# Patient Record
Sex: Female | Born: 1965 | Race: White | Hispanic: No | Smoking: Never smoker
Health system: Southern US, Community
[De-identification: ages and names within clinical notes are randomized; demographics above are authoritative.]

## PROBLEM LIST (undated history)

## (undated) DIAGNOSIS — R112 Nausea with vomiting, unspecified: Secondary | ICD-10-CM

## (undated) DIAGNOSIS — Z9889 Other specified postprocedural states: Secondary | ICD-10-CM

## (undated) DIAGNOSIS — K219 Gastro-esophageal reflux disease without esophagitis: Secondary | ICD-10-CM

## (undated) DIAGNOSIS — F32A Depression, unspecified: Secondary | ICD-10-CM

## (undated) DIAGNOSIS — G4730 Sleep apnea, unspecified: Secondary | ICD-10-CM

## (undated) DIAGNOSIS — G5603 Carpal tunnel syndrome, bilateral upper limbs: Secondary | ICD-10-CM

## (undated) HISTORY — PX: TUBAL LIGATION: SHX77

## (undated) HISTORY — PX: COLONOSCOPY: SHX174

## (undated) HISTORY — PX: OTHER SURGICAL HISTORY: SHX169

## (undated) HISTORY — PX: CHOLECYSTECTOMY: SHX55

## (undated) HISTORY — PX: LUMBAR FUSION: SHX111

## (undated) HISTORY — DX: Gastro-esophageal reflux disease without esophagitis: K21.9

---

## 2019-11-26 ENCOUNTER — Other Ambulatory Visit: Payer: Self-pay | Admitting: Surgery

## 2019-11-26 ENCOUNTER — Other Ambulatory Visit (HOSPITAL_COMMUNITY): Payer: Self-pay | Admitting: Surgery

## 2019-12-11 ENCOUNTER — Other Ambulatory Visit: Payer: Self-pay

## 2019-12-11 ENCOUNTER — Ambulatory Visit (HOSPITAL_COMMUNITY)
Admission: RE | Admit: 2019-12-11 | Discharge: 2019-12-11 | Disposition: A | Discharge status: Home / Self Care | Payer: Federal, State, Local not specified - PPO | Source: Ambulatory Visit | Attending: Surgery | Admitting: Surgery

## 2019-12-11 ENCOUNTER — Encounter: Payer: Federal, State, Local not specified - PPO | Attending: Surgery | Admitting: Skilled Nursing Facility1

## 2019-12-11 ENCOUNTER — Encounter: Payer: Self-pay | Admitting: Skilled Nursing Facility1

## 2019-12-11 DIAGNOSIS — E669 Obesity, unspecified: Secondary | ICD-10-CM | POA: Diagnosis present

## 2019-12-11 NOTE — Progress Notes (Signed)
Nutrition Assessment for Bariatric Surgery Medical Nutrition Therapy  Patient was seen on 12/11/2019 for Pre-Operative Nutrition Assessment. Letter of approval faxed to [facility] bariatric surgery program coordinator on 12/11/2019  Referral stated Supervised Weight Loss (SWL) visits needed: 3; but states is very adamant she does not need any and she is getting surgery in July so did not set pt up for SWL visits will do so if [facility] advises to do that  Planned surgery: RYGB Pt expectation of surgery: to lose weight Pt expectation of dietitian: none     NUTRITION ASSESSMENT   Anthropometrics  Start weight at [facility]: 213.8 lbs (date: 12/11/2019)  Height: 4 foot 11.8in in BMI: 41.88 kg/m2     Clinical  Medical hx: GERD Medications: See list Labs: none Notable signs/symptoms: some back aching   Lifestyle & Dietary Hx  Pt state she would not stop trying to lose weight if she did not get to 125 pounds. Pt states she has a horse Pricilla Holm) she enjoys training. Pt state she takes Wellbutrin because she gets panic attacks from having come off the Contrave.   24-Hr Dietary Recall First Meal: coffee + italian sweet cream creamer and waffles + sausage   Snack: cookies Second Meal: sandwich + pretzels Snack: crackers Third Meal: pork or chicken + rice + broccoli Snack: pudding or cookies Beverages: water, diet soda, coffee, decaf coffee   Estimated Energy Needs Calories: 1400 Carbohydrate: 158g Protein: 105 g Fat: 39g   NUTRITION DIAGNOSIS  Overweight/obesity ([place]-3.3) related to past poor dietary habits and physical inactivity as evidenced by patient w/ planned RYGB surgery following dietary guidelines for continued weight loss.    NUTRITION INTERVENTION  Nutrition counseling (C-1) and education (E-2) to facilitate bariatric surgery goals.   Pre-Op Goals Reviewed with the Patient . Track food and beverage intake (pen and paper, MyFitness Pal, Baritastic app,  etc.) . Make healthy food choices while monitoring portion sizes . Consume 3 meals per day or try to eat every 3-5 hours . Avoid concentrated sugars and fried foods . Keep sugar & fat in the single digits per serving on food labels . Practice CHEWING your food (aim for applesauce consistency) . Practice not drinking 15 minutes before, during, and 30 minutes after each meal and snack . Avoid all carbonated beverages (ex: soda, sparkling beverages)  . Limit caffeinated beverages (ex: coffee, tea, energy drinks) . Avoid all sugar-sweetened beverages (ex: regular soda, sports drinks)  . Avoid alcohol  . Aim for 64-100 ounces of FLUID daily (with at least half of fluid intake being plain water)  . Aim for at least 60-80 grams of PROTEIN daily . Look for a liquid protein source that contains ?15 g protein and ?5 g carbohydrate (ex: shakes, drinks, shots) . Make a list of non-food related activities . Physical activity is an important part of a healthy lifestyle so keep it moving! The goal is to reach 150 minutes of exercise per week, including cardiovascular and weight baring activity.  *Goals that are bolded indicate the pt would like to start working towards these  Handouts Provided Include  . Bariatric Surgery handouts (Nutrition Visits, Pre-Op Goals, Protein Shakes, Vitamins & Minerals)  Learning Style & Readiness for Change Teaching method utilized: Visual & Auditory  Demonstrated degree of understanding via: Teach Back  Barriers to learning/adherence to lifestyle change: none identified      MONITORING & EVALUATION Dietary intake, weekly physical activity, body weight, and pre-op goals reached at next nutrition visit.  Next Steps  Patient is to follow up at [facility] for Pre-Op Class >2 weeks before surgery for further nutrition education.

## 2019-12-12 ENCOUNTER — Other Ambulatory Visit (HOSPITAL_COMMUNITY): Payer: Federal, State, Local not specified - PPO

## 2019-12-12 ENCOUNTER — Ambulatory Visit (HOSPITAL_COMMUNITY): Payer: Federal, State, Local not specified - PPO

## 2019-12-16 ENCOUNTER — Ambulatory Visit: Payer: Federal, State, Local not specified - PPO | Admitting: Skilled Nursing Facility1

## 2020-01-01 ENCOUNTER — Ambulatory Visit (INDEPENDENT_AMBULATORY_CARE_PROVIDER_SITE_OTHER): Payer: Federal, State, Local not specified - PPO | Admitting: Psychology

## 2020-01-01 DIAGNOSIS — F509 Eating disorder, unspecified: Secondary | ICD-10-CM | POA: Diagnosis not present

## 2020-01-08 ENCOUNTER — Ambulatory Visit (INDEPENDENT_AMBULATORY_CARE_PROVIDER_SITE_OTHER): Payer: Federal, State, Local not specified - PPO | Admitting: Psychology

## 2020-01-08 ENCOUNTER — Ambulatory Visit: Payer: Federal, State, Local not specified - PPO | Admitting: Psychology

## 2020-01-08 DIAGNOSIS — F509 Eating disorder, unspecified: Secondary | ICD-10-CM | POA: Diagnosis not present

## 2020-03-15 ENCOUNTER — Other Ambulatory Visit: Payer: Self-pay

## 2020-03-15 ENCOUNTER — Encounter: Payer: Federal, State, Local not specified - PPO | Attending: Surgery | Admitting: Skilled Nursing Facility1

## 2020-03-15 DIAGNOSIS — E669 Obesity, unspecified: Secondary | ICD-10-CM | POA: Insufficient documentation

## 2020-03-15 NOTE — Progress Notes (Signed)
Pre-Operative Nutrition Class:  Appt start time: 2671   End time:  1830.  Patient was seen on 03/15/2020 for Pre-Operative [facility]ariatric Surgery [facility]    Surgery date:  Surgery type: RYGB Start weight at [facility]: 213.8 Weight today: 210    The following the learning objectives were met by the patient during this course:  Identify Pre-Op Dietary Goals and will begin 2 weeks pre-operatively  Identify appropriate sources of fluids and proteins   State protein recommendations and appropriate sources pre and post-operatively  Identify Post-Operative Dietary Goals and will follow for 2 weeks post-operatively  Identify appropriate multivitamin and calcium sources  Describe the need for physical activity post-operatively and will follow MD recommendations  State when to call healthcare provider regarding medication questions or post-operative complications  Handouts given during class include:  Pre-Op Bariatric Surgery Diet Handout  Protein Shake Handout  Post-Op Bariatric Surgery Nutrition Handout  [facility] Program Information Flyer  Support Group Information Flyer  [facility] Bariatric Supplements Price List  Follow-Up Plan: Patient will follow-up at [facility] 2 weeks post operatively for diet advancement per MD.

## 2020-04-07 ENCOUNTER — Encounter (HOSPITAL_COMMUNITY): Payer: Self-pay

## 2020-04-07 ENCOUNTER — Other Ambulatory Visit: Payer: Self-pay | Admitting: Surgery

## 2020-04-07 NOTE — Patient Instructions (Addendum)
DUE TO COVID-19 ONLY ONE VISITOR IS ALLOWED TO COME WITH YOU AND STAY IN THE WAITING ROOM ONLY DURING PRE OP AND PROCEDURE.   IF YOU WILL BE ADMITTED INTO THE HOSPITAL YOU ARE ALLOWED ONE SUPPORT PERSON DURING VISITATION HOURS ONLY (10AM -8PM)   . The support person may change daily. . The support person must pass our screening, gel in and out, and wear a mask at all times, including in the patient's room. . Patients must also wear a mask when staff or their support person are in the room.   COVID SWAB TESTING MUST BE COMPLETED ON: Friday, Sept. 24, 2021 at 2:55 PM   [address] [place], [place] [address]  (Must self quarantine after testing. Follow instructions on handout.)       Your procedure is scheduled on: Tuesday, Sept 28, 2021   Report to [facility] Main  Entrance    Report to admitting at 7:45 AM   Call this number if you have problems the morning of surgery [phone]   NO SOLID FOOD AFTER 600 PM THE NIGHT BEFORE YOUR SURGERY.    YOU MAY DRINK CLEAR FLUIDS.    CLEAR LIQUID DIET  Foods Allowed                                                                     Foods Excluded  Water, Black Coffee and tea, regular and decaf                             liquids that you cannot  Plain Jell-O in any flavor  (No red)                                           see through such as: Fruit ices (not with fruit pulp)                                     milk, soups, orange juice  Iced Popsicles (No red)                                    All solid food                                   Apple juices Sports drinks like Gatorade (No red) Lightly seasoned clear broth or consume(fat free) Sugar, honey syrup  Sample Menu Breakfast                                Lunch                                     Supper Cranberry juice  Beef broth                            Chicken broth Jell-O                                     Grape juice                            Apple juice Coffee or tea                        Jell-O                                      Popsicle                                                Coffee or tea                        Coffee or tea   MORNING OF SURGERY DRINK:   DRINK 1 G2 drink BEFORE YOU LEAVE HOME, DRINK ALL OF THE  G2 DRINK AT ONE TIME.  THE G2 DRINK YOU DRINK BEFORE YOU LEAVE HOME WILL BE  THE LAST FLUIDS YOU DRINK BEFORE SURGERY.    May have liquids until    day of surgery  CLEAR LIQUID DIET  Foods Allowed                                                                     Foods Excluded  Water, Black Coffee and tea, regular and decaf                             liquids that you cannot  Plain Jell-O in any flavor  (No red)                                           see through such as: Fruit ices (not with fruit pulp)                                     milk, soups, orange juice              Iced Popsicles (No red)                                    All solid food  Apple juices Sports drinks like Gatorade (No red) Lightly seasoned clear broth or consume(fat free) Sugar, honey syrup  Sample Menu Breakfast                                Lunch                                     Supper Cranberry juice                    Beef broth                            Chicken broth Jell-O                                     Grape juice                           Apple juice Coffee or tea                        Jell-O                                      Popsicle                                                Coffee or tea                        Coffee or tea         Oral Hygiene is also important to reduce your risk of infection.                                    Remember - BRUSH YOUR TEETH THE MORNING OF SURGERY WITH YOUR REGULAR TOOTHPASTE   Do NOT smoke after Midnight   Take these medicines the morning of surgery with A SIP OF WATER: Bupropion, Famotidine   Bring CPAP mask and tubing  day of surgery                               You may not have any metal on your body including hair pins, jewelry, and body piercings             Do not wear make-up, lotions, powders, perfumes/cologne, or deodorant             Do not wear nail polish.  Do not shave  48 hours prior to surgery.               Do not bring valuables to the hospital. [facility] IS NOT             RESPONSIBLE   FOR VALUABLES.   Contacts, dentures or bridgework may not be worn into surgery.  Patients discharged the day of surgery will not be allowed to drive home.   Special Instructions: Bring a copy of your healthcare power of attorney and living will documents         the day of surgery if you haven't scanned them in before.              Please read over the following fact sheets you were given: IF YOU HAVE QUESTIONS ABOUT YOUR PRE OP INSTRUCTIONS PLEASE CALL [phone]   PAIN IS EXPECTED AFTER SURGERY AND WILL NOT BE COMPLETELY ELIMINATED. AMBULATION AND TYLENOL WILL HELP REDUCE INCISIONAL AND GAS PAIN. MOVEMENT IS KEY!   YOU ARE EXPECTED TO BE OUT OF BED WITHIN 4 HOURS OF ADMISSION TO YOUR PATIENT ROOM.   SITTING IN THE RECLINER THROUGHOUT THE DAY IS IMPORTANT FOR DRINKING FLUIDS AND MOVING GAS THROUGHOUT THE GI TRACT.   COMPRESSION STOCKINGS SHOULD BE WORN [facility] STAY UNLESS YOU ARE WALKING.    INCENTIVE SPIROMETER SHOULD BE USED EVERY HOUR WHILE AWAKE TO DECREASE POST-OPERATIVE COMPLICATIONS SUCH AS PNEUMONIA.   WHEN DISCHARGED HOME, IT IS IMPORTANT TO CONTINUE TO WALK EVERY HOUR AND USE THE INCENTIVE SPIROMETER EVERY HOUR.      [facility] - Preparing for Surgery Before surgery, you can play an important role.  Because skin is not sterile, your skin needs to be as free of germs as possible.  You can reduce the number of germs on your skin by washing with CHG (chlorahexidine gluconate) soap before surgery.  CHG is an antiseptic cleaner which kills germs and bonds with the skin to  continue killing germs even after washing. Please DO NOT use if you have an allergy to CHG or antibacterial soaps.  If your skin becomes reddened/irritated stop using the CHG and inform your nurse when you arrive at Short Stay. Do not shave (including legs and underarms) for at least 48 hours prior to the first CHG shower.  You may shave your face/neck.  Please follow these instructions carefully:  1.  Shower with CHG Soap the night before surgery and the  morning of surgery.  2.  If you choose to wash your hair, wash your hair first as usual with your normal  shampoo.  3.  After you shampoo, rinse your hair and body thoroughly to remove the shampoo.                             4.  Use CHG as you would any other liquid soap.  You can apply chg directly to the skin and wash.  Gently with a scrungie or clean washcloth.  5.  Apply the CHG Soap to your body ONLY FROM THE NECK DOWN.   Do   not use on face/ open                           Wound or open sores. Avoid contact with eyes, ears mouth and   genitals (private parts).                       Wash face,  Genitals (private parts) with your normal soap.             6.  Wash thoroughly, paying special attention to the area where your    surgery  will be performed.  7.  Thoroughly rinse your body with warm water  from the neck down.  8.  DO NOT shower/wash with your normal soap after using and rinsing off the CHG Soap.                9.  Pat yourself dry with a clean towel.            10.  Wear clean pajamas.            11.  Place clean sheets on your bed the night of your first shower and do not  sleep with pets. Day of Surgery : Do not apply any lotions/deodorants the morning of surgery.  Please wear clean clothes to the hospital/surgery center.  FAILURE TO FOLLOW THESE INSTRUCTIONS MAY RESULT IN THE CANCELLATION OF YOUR SURGERY  PATIENT SIGNATURE_________________________________  NURSE  SIGNATURE__________________________________  ________________________________________________________________________   Rogelia MireIncentive Spirometer  An incentive spirometer is a tool that can help keep your lungs clear and active. This tool measures how well you are filling your lungs with each breath. Taking long deep breaths may help reverse or decrease the chance of developing breathing (pulmonary) problems (especially infection) following:  A long period of time when you are unable to move or be active. BEFORE THE PROCEDURE   If the spirometer includes an indicator to show your best effort, your nurse or respiratory therapist will set it to a desired goal.  If possible, sit up straight or lean slightly forward. Try not to slouch.  Hold the incentive spirometer in an upright position. INSTRUCTIONS FOR USE  1. Sit on the edge of your bed if possible, or sit up as far as you can in bed or on a chair. 2. Hold the incentive spirometer in an upright position. 3. Breathe out normally. 4. Place the mouthpiece in your mouth and seal your lips tightly around it. 5. Breathe in slowly and as deeply as possible, raising the piston or the ball toward the top of the column. 6. Hold your breath for 3-5 seconds or for as long as possible. Allow the piston or ball to fall to the bottom of the column. 7. Remove the mouthpiece from your mouth and breathe out normally. 8. Rest for a few seconds and repeat Steps 1 through 7 at least 10 times every 1-2 hours when you are awake. Take your time and take a few normal breaths between deep breaths. 9. The spirometer may include an indicator to show your best effort. Use the indicator as a goal to work toward during each repetition. 10. After each set of 10 deep breaths, practice coughing to be sure your lungs are clear. If you have an incision (the cut made at the time of surgery), support your incision when coughing by placing a pillow or rolled up towels firmly  against it. Once you are able to get out of bed, walk around indoors and cough well. You may stop using the incentive spirometer when instructed by your caregiver.  RISKS AND COMPLICATIONS  Take your time so you do not get dizzy or light-headed.  If you are in pain, you may need to take or ask for pain medication before doing incentive spirometry. It is harder to take a deep breath if you are having pain. AFTER USE  Rest and breathe slowly and easily.  It can be helpful to keep track of a log of your progress. Your caregiver can provide you with a simple table to help with this. If you are using the spirometer at home, follow these instructions: SEEK  MEDICAL CARE IF:   You are having difficultly using the spirometer.  You have trouble using the spirometer as often as instructed.  Your pain medication is not giving enough relief while using the spirometer.  You develop fever of 100.5 F (38.1 C) or higher. SEEK IMMEDIATE MEDICAL CARE IF:   You cough up bloody sputum that had not been present before.  You develop fever of 102 F (38.9 C) or greater.  You develop worsening pain at or near the incision site. MAKE SURE YOU:   Understand these instructions.  Will watch your condition.  Will get help right away if you are not doing well or get worse. Document Released: 11/27/2006 Document Revised: 10/09/2011 Document Reviewed: 01/28/2007 Exit[employer] Patient Information 2014 ExitCare, [place].   ________________________________________________________________________  WHAT IS A BLOOD TRANSFUSION? Blood Transfusion Information  A transfusion is the replacement of blood or some of its parts. Blood is made up of multiple cells which provide different functions.  Red blood cells carry oxygen and are used for blood loss replacement.  White blood cells fight against infection.  Platelets control bleeding.  Plasma helps clot blood.  Other blood products are available for  specialized needs, such as hemophilia or other clotting disorders. BEFORE THE TRANSFUSION  Who gives blood for transfusions?   Healthy volunteers who are fully evaluated to make sure their blood is safe. This is blood bank blood. Transfusion therapy is the safest it has ever been in the practice of medicine. Before blood is taken from a donor, a complete history is taken to make sure that person has no history of diseases nor engages in risky social behavior (examples are intravenous drug use or sexual activity with multiple partners). The donor's travel history is screened to minimize risk of transmitting infections, such as malaria. The donated blood is tested for signs of infectious diseases, such as HIV and hepatitis. The blood is then tested to be sure it is compatible with you in order to minimize the chance of a transfusion reaction. If you or a relative donates blood, this is often done in anticipation of surgery and is not appropriate for emergency situations. It takes many days to process the donated blood. RISKS AND COMPLICATIONS Although transfusion therapy is very safe and saves many lives, the main dangers of transfusion include:   Getting an infectious disease.  Developing a transfusion reaction. This is an allergic reaction to something in the blood you were given. Every precaution is taken to prevent this. The decision to have a blood transfusion has been considered carefully by your caregiver before blood is given. Blood is not given unless the benefits outweigh the risks. AFTER THE TRANSFUSION  Right after receiving a blood transfusion, you will usually feel much better and more energetic. This is especially true if your red blood cells have gotten low (anemic). The transfusion raises the level of the red blood cells which carry oxygen, and this usually causes an energy increase.  The nurse administering the transfusion will monitor you carefully for complications. HOME CARE  INSTRUCTIONS  No special instructions are needed after a transfusion. You may find your energy is better. Speak with your caregiver about any limitations on activity for underlying diseases you may have. SEEK MEDICAL CARE IF:   Your condition is not improving after your transfusion.  You develop redness or irritation at the intravenous (IV) site. SEEK IMMEDIATE MEDICAL CARE IF:  Any of the following symptoms occur over the next 12 hours:  Shaking chills.  You have a temperature by mouth above 102 F (38.9 C), not controlled by medicine.  Chest, back, or muscle pain.  People around you feel you are not acting correctly or are confused.  Shortness of breath or difficulty breathing.  Dizziness and fainting.  You get a rash or develop hives.  You have a decrease in urine output.  Your urine turns a dark color or changes to pink, red, or brown. Any of the following symptoms occur over the next 10 days:  You have a temperature by mouth above 102 F (38.9 C), not controlled by medicine.  Shortness of breath.  Weakness after normal activity.  The white part of the eye turns yellow (jaundice).  You have a decrease in the amount of urine or are urinating less often.  Your urine turns a dark color or changes to pink, red, or brown. Document Released: 07/14/2000 Document Revised: 10/09/2011 Document Reviewed: 03/02/2008 [facility] Patient Information 2014 [place], [place].  _______________________________________________________________________

## 2020-04-07 NOTE — Progress Notes (Addendum)
COVID Vaccine Completed: Yes Date COVID Vaccine completed: 08/04/2019, 08/25/2019 COVID vaccine manufacturer: Pfizer      PCP - Linton Rump NP Cardiologist - N/A  Chest x-ray - 12/12/19 in epic EKG - 12/11/2019 in epic Stress Test - N/A ECHO - N/A Cardiac Cath - N/A  Sleep Study - Yes CPAP - Yes  Fasting Blood Sugar - N/A Checks Blood Sugar _N/A____ times a day  Blood Thinner Instructions: N/A Aspirin Instructions: N/A Last Dose: N/A  Anesthesia review: OSA  Patient denies shortness of breath, fever, cough and chest pain at PAT appointment   Patient verbalized understanding of instructions that were given to them at the PAT appointment. Patient was also instructed that they will need to review over the PAT instructions again at home before surgery.

## 2020-04-16 ENCOUNTER — Encounter (HOSPITAL_COMMUNITY)
Admission: RE | Admit: 2020-04-16 | Discharge: 2020-04-16 | Disposition: A | Discharge status: Home / Self Care | Payer: Federal, State, Local not specified - PPO | Source: Ambulatory Visit | Attending: Surgery | Admitting: Surgery

## 2020-04-16 ENCOUNTER — Encounter (HOSPITAL_COMMUNITY): Payer: Self-pay

## 2020-04-16 ENCOUNTER — Other Ambulatory Visit: Payer: Self-pay

## 2020-04-16 DIAGNOSIS — Z01812 Encounter for preprocedural laboratory examination: Secondary | ICD-10-CM | POA: Insufficient documentation

## 2020-04-16 HISTORY — DX: Carpal tunnel syndrome, bilateral upper limbs: G56.03

## 2020-04-16 HISTORY — DX: Nausea with vomiting, unspecified: Z98.890

## 2020-04-16 HISTORY — DX: Depression, unspecified: F32.A

## 2020-04-16 HISTORY — DX: Other specified postprocedural states: R11.2

## 2020-04-16 HISTORY — DX: Sleep apnea, unspecified: G47.30

## 2020-04-16 LAB — COMPREHENSIVE METABOLIC PANEL
ALT: 21 U/L (ref 0–44)
AST: 21 U/L (ref 15–41)
Albumin: 4.2 g/dL (ref 3.5–5.0)
Alkaline Phosphatase: 78 U/L (ref 38–126)
Anion gap: 10 (ref 5–15)
BUN: 17 mg/dL (ref 6–20)
CO2: 26 mmol/L (ref 22–32)
Calcium: 9.4 mg/dL (ref 8.9–10.3)
Chloride: 102 mmol/L (ref 98–111)
Creatinine, Ser: 0.93 mg/dL (ref 0.44–1.00)
GFR calc Af Amer: >60 mL/min (ref >60)
GFR calc non Af Amer: >60 mL/min (ref >60)
Glucose, Bld: 95 mg/dL (ref 70–99)
Potassium: 4.7 mmol/L (ref 3.5–5.1)
Sodium: 138 mmol/L (ref 135–145)
Total Bilirubin: 0.4 mg/dL (ref 0.3–1.2)
Total Protein: 7.9 g/dL (ref 6.5–8.1)

## 2020-04-16 LAB — TYPE AND SCREEN
ABO/RH(D): A POS
Antibody Screen: NEGATIVE

## 2020-04-16 LAB — CBC WITH DIFFERENTIAL/PLATELET
Abs Immature Granulocytes: 0.04 10*3/uL (ref 0.00–0.07)
Basophils Absolute: 0.1 10*3/uL (ref 0.0–0.1)
Basophils Relative: 1 %
Eosinophils Absolute: 0.1 10*3/uL (ref 0.0–0.5)
Eosinophils Relative: 1 %
HCT: 40.1 % (ref 36.0–46.0)
Hemoglobin: 12.8 g/dL (ref 12.0–15.0)
Immature Granulocytes: 0 %
Lymphocytes Relative: 25 %
Lymphs Abs: 2.7 10*3/uL (ref 0.7–4.0)
MCH: 27.8 pg (ref 26.0–34.0)
MCHC: 31.9 g/dL (ref 30.0–36.0)
MCV: 87 fL (ref 80.0–100.0)
Monocytes Absolute: 0.8 10*3/uL (ref 0.1–1.0)
Monocytes Relative: 7 %
Neutro Abs: 7.3 10*3/uL (ref 1.7–7.7)
Neutrophils Relative %: 66 %
Platelets: 323 10*3/uL (ref 150–400)
RBC: 4.61 MIL/uL (ref 3.87–5.11)
RDW: 13.7 % (ref 11.5–15.5)
WBC: 11 10*3/uL — ABNORMAL HIGH (ref 4.0–10.5)
nRBC: 0 % (ref 0.0–0.2)

## 2020-04-19 ENCOUNTER — Encounter (HOSPITAL_COMMUNITY): Payer: Self-pay

## 2020-04-23 ENCOUNTER — Other Ambulatory Visit (HOSPITAL_COMMUNITY)
Admission: RE | Admit: 2020-04-23 | Discharge: 2020-04-23 | Disposition: A | Discharge status: Home / Self Care | Payer: Federal, State, Local not specified - PPO | Source: Ambulatory Visit | Attending: Surgery | Admitting: Surgery

## 2020-04-23 DIAGNOSIS — Z20822 Contact with and (suspected) exposure to covid-19: Secondary | ICD-10-CM | POA: Diagnosis not present

## 2020-04-23 DIAGNOSIS — Z01812 Encounter for preprocedural laboratory examination: Secondary | ICD-10-CM | POA: Insufficient documentation

## 2020-04-23 LAB — SARS CORONAVIRUS 2 (TAT 6-24 HRS): SARS Coronavirus 2: NEGATIVE

## 2020-04-26 ENCOUNTER — Encounter (HOSPITAL_COMMUNITY): Payer: Self-pay | Admitting: Surgery

## 2020-04-26 MED ORDER — BUPIVACAINE LIPOSOME 1.3 % IJ SUSP
20.0000 mL | Freq: Once | INTRAMUSCULAR | Status: DC
  Filled 2020-04-26: qty 20

## 2020-04-26 NOTE — H&P (Signed)
Lindsey Whitaker  Location: [facility] Patient #: [id number] DOB: April 08, 1966 Single / Language: [place] / Race: White Female  History of Present Illness   The patient is a 54 year old female who presents with a complaint of weight loss surgery.  The PCP is Linton Rump, NP [facility])  She comes by herself.  She is highly motivated for the surgery. She has done thorough research and knows all the benefits/risks of the surgery. She has the background of a nurse. I spent some time going over the [facility] evaluation and repair. She does have moderate reflux, but it is controlled well with oral meds.  UGI - 12/11/2019 - small to moderate HH Nutrition - 03/15/2020 - she saw Serena Colonel Psych - She saw Delight Ovens on 01/08/2020  History of weight problems: She has been looking at weight loss surgery for at least 4 years. She actually had a psych eval in [place] in July 2017. She moved up to [place] about 3 years ago. She was originally interested in the lap band, but on reading about this she realizes this may not be the best choice. I would lean her away from the lap band, unless she could find 3 patiens that she has talked thoroughly to about the pros/cons of the lap band. She has daily reflux for which she takes Pepcid twice a day, so she is probably a poor candidate for a lap sleeve. She has tried multiple diets including: Weight Watchers, Atkins, Slim fast, and she did the supervised weight loss at [facility]. She has tried multiple prescription diet pills including: Belviq, Contrave, and Qsymia. Belviq,  Per the 1991 NIH Consensus Statement, the patient is a candidate for bariatric surgery. The patient attended an information session and reviewed the types of bariatric surgery.  The patient is interested in the Roux en Y Gastric Bypass. I think that her reflux would make the lap sleeve more  problematic. I discussed with the patient the indications and risks of bariatric surgery. The potential risks of surgery include, but are not limited to, bleeding, infection, leak from the bowel, DVT and PE, open surgery, long term nutrition consequences, and death. The patient understands the importance of compliance and long term follow-up with our group after surgery. From here we will obtain lab tests, x-rays, and nutrition consult. She already has a psych consult from [place] dated 02/03/2016 by Ronnald Collum, PhD. We also talked about a realistic goal weight of 150 pounds.  Plan: 1. She is schedule for a Roux en Y gastric bypass, HH repair, and upper endo on 9/28.  Review of Systems as stated in this history (HPI) or in the review of systems. Otherwise all other 12 point ROS are negative  Past Medical History: 1. 2016 - lumbar fusion for scoliosis 2. GERD - takes famotidine BID She has a hiatal hernia on the UGI - 12/11/2019 3. Lap chole - 1990 4. HTN 5. OSA (seen through [facility]) She is on CPAP  Social History: Divorced, but has significant other. Her fiancee is Kizzie Bane. They have no specific date for a wedding. She comes with her mother, Derrill Kay. She has no children. She does have horses. 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  Allergies Maurilio Lovely, CMA; 04/07/2020 10:54 AM) No Known Drug Allergies  [11/20/2019]: Allergies Reconciled   Medication History Maurilio Lovely,  CMA; 04/07/2020 10:54 AM) Wellbutrin (Oral) Specific strength unknown - Active. Famotidine (Oral) Specific strength unknown - Active. traZODone HCl (50MG  Tablet, Oral) Active. Aspirin (81MG  Tablet, Oral) Active. Multi-Vitamin (Oral) Active. Fexofenadine HCl (Oral) Specific strength unknown - Active. Medications Reconciled  Vitals  CMA; 04/07/2020 10:56 AM) 04/07/2020 10:54 AM Weight: 207.8 lb Height: 59.75in Body Surface Area: 1.89 m Body Mass Index: 40.92 kg/m  Temp.: 97F (Tympanic)  Pulse: 92 (Regular)  BP: 132/74(Sitting, Left Arm, Standard)   Physical Exam  General: Obese short WF who is alert and generally healthy appearing. She is wearing a mask. HEENT: Normal. Pupils equal.  Neck: Supple. No mass. No thyroid mass.  Lymph Nodes: No supraclavicular or cervical nodes.  Lungs: Clear to auscultation and symmetric breath sounds. Heart: RRR. No murmur or rub.  Abdomen: Soft. No mass. No tenderness. No hernia. Normal bowel sounds.   She is about 1/2 pear and 1/2 apple. Rectal: Not done.  Extremities: Good strength and ROM in upper and lower extremities.  Neurologic: Grossly intact to motor and sensory function. Psychiatric: Has normal mood and affect. Behavior is normal.   Assessment & Plan 06/07/2020 H. 06/07/2020 MD; 04/07/2020 11:31 AM) 1.  OBESITY, MORBID, BMI 40.0-49.9 (E66.01)  Plan:  1. Roux en y Gastric bypass with hiatal hernia repair - 04/27/2020  2. To send in Protonix and Zofran  2.  GERD (GASTROESOPHAGEAL REFLUX DISEASE) (K21.9)  takes famotidine BID  She has a hiatal hernia on the UGI - 12/11/2019 3.  OSA ON CPAP (G47.33)  (seen through [facility]) 4. 2016 - lumbar fusion for scoliosis 5. HTN   [facility], MD, [facility] Surgery Office phone:  [phone]

## 2020-04-27 ENCOUNTER — Encounter (HOSPITAL_COMMUNITY)
Admission: RE | Disposition: A | Discharge status: Home / Self Care | Payer: Self-pay | Source: Home / Self Care | Attending: Surgery

## 2020-04-27 ENCOUNTER — Other Ambulatory Visit: Payer: Self-pay

## 2020-04-27 ENCOUNTER — Inpatient Hospital Stay (HOSPITAL_COMMUNITY)
Admission: RE | Admit: 2020-04-27 | Discharge: 2020-04-29 | DRG: 621 | Disposition: A | Discharge status: Home / Self Care | Payer: Federal, State, Local not specified - PPO | Attending: Surgery | Admitting: Surgery

## 2020-04-27 ENCOUNTER — Inpatient Hospital Stay (HOSPITAL_COMMUNITY): Payer: Federal, State, Local not specified - PPO | Admitting: Anesthesiology

## 2020-04-27 ENCOUNTER — Inpatient Hospital Stay (HOSPITAL_COMMUNITY): Payer: Federal, State, Local not specified - PPO | Admitting: Physician Assistant

## 2020-04-27 ENCOUNTER — Encounter (HOSPITAL_COMMUNITY): Payer: Self-pay | Admitting: Surgery

## 2020-04-27 DIAGNOSIS — I10 Essential (primary) hypertension: Secondary | ICD-10-CM | POA: Diagnosis present

## 2020-04-27 DIAGNOSIS — M25521 Pain in right elbow: Secondary | ICD-10-CM | POA: Diagnosis present

## 2020-04-27 DIAGNOSIS — G4733 Obstructive sleep apnea (adult) (pediatric): Secondary | ICD-10-CM | POA: Diagnosis present

## 2020-04-27 DIAGNOSIS — Z6841 Body Mass Index (BMI) 40.0 and over, adult: Secondary | ICD-10-CM | POA: Diagnosis not present

## 2020-04-27 DIAGNOSIS — Z981 Arthrodesis status: Secondary | ICD-10-CM | POA: Diagnosis not present

## 2020-04-27 DIAGNOSIS — K219 Gastro-esophageal reflux disease without esophagitis: Secondary | ICD-10-CM | POA: Diagnosis present

## 2020-04-27 DIAGNOSIS — K449 Diaphragmatic hernia without obstruction or gangrene: Secondary | ICD-10-CM | POA: Diagnosis present

## 2020-04-27 DIAGNOSIS — E669 Obesity, unspecified: Secondary | ICD-10-CM | POA: Diagnosis present

## 2020-04-27 HISTORY — PX: GASTRIC ROUX-EN-Y: SHX5262

## 2020-04-27 LAB — HEMOGLOBIN AND HEMATOCRIT, BLOOD
HCT: 41.5 % (ref 36.0–46.0)
Hemoglobin: 13.2 g/dL (ref 12.0–15.0)

## 2020-04-27 LAB — ABO/RH: ABO/RH(D): A POS

## 2020-04-27 SURGERY — LAPAROSCOPIC ROUX-EN-Y GASTRIC BYPASS WITH UPPER ENDOSCOPY
Anesthesia: General

## 2020-04-27 MED ORDER — EPHEDRINE 5 MG/ML INJ
INTRAVENOUS | Status: AC
  Filled 2020-04-27: qty 10

## 2020-04-27 MED ORDER — ORAL CARE MOUTH RINSE: 15.0000 mL | Freq: Once | OROMUCOSAL | Status: AC

## 2020-04-27 MED ORDER — CHLORHEXIDINE GLUCONATE 0.12 % MT SOLN
15.0000 mL | Freq: Once | OROMUCOSAL | Status: AC
  Administered 2020-04-27: 15 mL via OROMUCOSAL

## 2020-04-27 MED ORDER — DROPERIDOL 2.5 MG/ML IJ SOLN
INTRAMUSCULAR | Status: AC
  Filled 2020-04-27: qty 2

## 2020-04-27 MED ORDER — LACTATED RINGERS IV SOLN: INTRAVENOUS | Status: DC

## 2020-04-27 MED ORDER — FENTANYL CITRATE (PF) 100 MCG/2ML IJ SOLN
INTRAMUSCULAR | Status: AC
  Filled 2020-04-27: qty 2

## 2020-04-27 MED ORDER — OXYCODONE HCL 5 MG/5ML PO SOLN
5.0000 mg | Freq: Four times a day (QID) | ORAL | Status: DC | PRN
  Administered 2020-04-28 – 2020-04-29 (×5): 5 mg via ORAL
  Filled 2020-04-27 (×5): qty 5

## 2020-04-27 MED ORDER — MIDAZOLAM HCL 5 MG/5ML IJ SOLN
INTRAMUSCULAR | Status: DC | PRN
  Administered 2020-04-27: 2 mg via INTRAVENOUS

## 2020-04-27 MED ORDER — LIDOCAINE 2% (20 MG/ML) 5 ML SYRINGE
INTRAMUSCULAR | Status: DC | PRN
  Administered 2020-04-27: 1.5 mg/kg/h via INTRAVENOUS
  Administered 2020-04-27: 100 mg via INTRAVENOUS

## 2020-04-27 MED ORDER — TISSEEL VH 10 ML EX KIT
PACK | CUTANEOUS | Status: AC
  Filled 2020-04-27: qty 1

## 2020-04-27 MED ORDER — TISSEEL VH 10 ML EX KIT
PACK | CUTANEOUS | Status: DC | PRN
  Administered 2020-04-27: 1

## 2020-04-27 MED ORDER — ONDANSETRON HCL 4 MG/2ML IJ SOLN
4.0000 mg | INTRAMUSCULAR | Status: DC | PRN
  Administered 2020-04-27 – 2020-04-29 (×8): 4 mg via INTRAVENOUS
  Filled 2020-04-27 (×8): qty 2

## 2020-04-27 MED ORDER — MIDAZOLAM HCL 2 MG/2ML IJ SOLN
INTRAMUSCULAR | Status: AC
  Filled 2020-04-27: qty 2

## 2020-04-27 MED ORDER — ACETAMINOPHEN 500 MG PO TABS
1000.0000 mg | ORAL_TABLET | ORAL | Status: AC
  Administered 2020-04-27: 1000 mg via ORAL
  Filled 2020-04-27: qty 2

## 2020-04-27 MED ORDER — GABAPENTIN 300 MG PO CAPS
300.0000 mg | ORAL_CAPSULE | ORAL | Status: AC
  Administered 2020-04-27: 300 mg via ORAL
  Filled 2020-04-27: qty 1

## 2020-04-27 MED ORDER — PROPOFOL 10 MG/ML IV BOLUS
INTRAVENOUS | Status: DC | PRN
  Administered 2020-04-27: 160 mg via INTRAVENOUS

## 2020-04-27 MED ORDER — KETAMINE HCL 10 MG/ML IJ SOLN
INTRAMUSCULAR | Status: DC | PRN
  Administered 2020-04-27: 30 mg via INTRAVENOUS

## 2020-04-27 MED ORDER — APREPITANT 40 MG PO CAPS
40.0000 mg | ORAL_CAPSULE | ORAL | Status: AC
  Administered 2020-04-27: 40 mg via ORAL
  Filled 2020-04-27: qty 1

## 2020-04-27 MED ORDER — HYDROMORPHONE HCL 1 MG/ML IJ SOLN
INTRAMUSCULAR | Status: AC
Start: 2020-04-27 — End: 2020-04-28
  Filled 2020-04-27: qty 1

## 2020-04-27 MED ORDER — BUPIVACAINE-EPINEPHRINE 0.25% -1:200000 IJ SOLN
INTRAMUSCULAR | Status: DC | PRN
  Administered 2020-04-27: 30 mL

## 2020-04-27 MED ORDER — MORPHINE SULFATE (PF) 2 MG/ML IV SOLN
1.0000 mg | INTRAVENOUS | Status: DC | PRN
  Administered 2020-04-28: 2 mg via INTRAVENOUS
  Filled 2020-04-27: qty 1

## 2020-04-27 MED ORDER — ONDANSETRON HCL 4 MG/2ML IJ SOLN
INTRAMUSCULAR | Status: AC
  Filled 2020-04-27: qty 2

## 2020-04-27 MED ORDER — SODIUM CHLORIDE 0.9 % IV SOLN
2.0000 g | INTRAVENOUS | Status: AC
  Administered 2020-04-27: 2 g via INTRAVENOUS
  Filled 2020-04-27: qty 2

## 2020-04-27 MED ORDER — ROCURONIUM BROMIDE 10 MG/ML (PF) SYRINGE
PREFILLED_SYRINGE | INTRAVENOUS | Status: DC | PRN
  Administered 2020-04-27: 70 mg via INTRAVENOUS
  Administered 2020-04-27 (×2): 10 mg via INTRAVENOUS
  Administered 2020-04-27: 20 mg via INTRAVENOUS

## 2020-04-27 MED ORDER — PANTOPRAZOLE SODIUM 40 MG IV SOLR
40.0000 mg | Freq: Every day | INTRAVENOUS | Status: DC
  Administered 2020-04-27 – 2020-04-28 (×2): 40 mg via INTRAVENOUS
  Filled 2020-04-27 (×2): qty 40

## 2020-04-27 MED ORDER — CHLORHEXIDINE GLUCONATE 4 % EX LIQD: 60.0000 mL | Freq: Once | CUTANEOUS | Status: DC

## 2020-04-27 MED ORDER — BUPIVACAINE-EPINEPHRINE (PF) 0.25% -1:200000 IJ SOLN
INTRAMUSCULAR | Status: AC
  Filled 2020-04-27: qty 30

## 2020-04-27 MED ORDER — ACETAMINOPHEN 160 MG/5ML PO SOLN
1000.0000 mg | Freq: Three times a day (TID) | ORAL | Status: DC
  Administered 2020-04-27 – 2020-04-29 (×4): 1000 mg via ORAL
  Filled 2020-04-27 (×4): qty 40.6

## 2020-04-27 MED ORDER — SCOPOLAMINE 1 MG/3DAYS TD PT72
1.0000 | MEDICATED_PATCH | TRANSDERMAL | Status: DC
  Administered 2020-04-27: 1.5 mg via TRANSDERMAL
  Filled 2020-04-27: qty 1

## 2020-04-27 MED ORDER — ENSURE MAX PROTEIN PO LIQD
2.0000 [oz_av] | ORAL | Status: DC
  Administered 2020-04-28 – 2020-04-29 (×5): 2 [oz_av] via ORAL
  Filled 2020-04-27 (×17): qty 330

## 2020-04-27 MED ORDER — SUCCINYLCHOLINE CHLORIDE 200 MG/10ML IV SOSY
PREFILLED_SYRINGE | INTRAVENOUS | Status: AC
  Filled 2020-04-27: qty 10

## 2020-04-27 MED ORDER — BUPROPION HCL ER (SR) 150 MG PO TB12
150.0000 mg | ORAL_TABLET | Freq: Two times a day (BID) | ORAL | Status: DC
  Administered 2020-04-28 – 2020-04-29 (×3): 150 mg via ORAL
  Filled 2020-04-27 (×3): qty 1

## 2020-04-27 MED ORDER — ACETAMINOPHEN 500 MG PO TABS
1000.0000 mg | ORAL_TABLET | Freq: Three times a day (TID) | ORAL | Status: DC
  Administered 2020-04-27 – 2020-04-28 (×2): 1000 mg via ORAL
  Filled 2020-04-27 (×2): qty 2

## 2020-04-27 MED ORDER — 0.9 % SODIUM CHLORIDE (POUR BTL) OPTIME
TOPICAL | Status: DC | PRN
  Administered 2020-04-27: 1000 mL

## 2020-04-27 MED ORDER — KCL IN DEXTROSE-NACL 20-5-0.45 MEQ/L-%-% IV SOLN
INTRAVENOUS | Status: DC
  Filled 2020-04-27 (×4): qty 1000

## 2020-04-27 MED ORDER — BUPIVACAINE LIPOSOME 1.3 % IJ SUSP
INTRAMUSCULAR | Status: DC | PRN
  Administered 2020-04-27: 20 mL

## 2020-04-27 MED ORDER — HEPARIN SODIUM (PORCINE) 5000 UNIT/ML IJ SOLN
5000.0000 [IU] | INTRAMUSCULAR | Status: AC
  Administered 2020-04-27: 5000 [IU] via SUBCUTANEOUS
  Filled 2020-04-27: qty 1

## 2020-04-27 MED ORDER — ONDANSETRON HCL 4 MG/2ML IJ SOLN
INTRAMUSCULAR | Status: DC | PRN
  Administered 2020-04-27 (×2): 4 mg via INTRAVENOUS

## 2020-04-27 MED ORDER — ENOXAPARIN SODIUM 30 MG/0.3ML ~~LOC~~ SOLN
30.0000 mg | Freq: Two times a day (BID) | SUBCUTANEOUS | Status: DC
  Administered 2020-04-27 – 2020-04-29 (×4): 30 mg via SUBCUTANEOUS
  Filled 2020-04-27 (×4): qty 0.3

## 2020-04-27 MED ORDER — KETOROLAC TROMETHAMINE 30 MG/ML IJ SOLN: 30.0000 mg | Freq: Once | INTRAMUSCULAR | Status: DC | PRN

## 2020-04-27 MED ORDER — DROPERIDOL 2.5 MG/ML IJ SOLN
INTRAMUSCULAR | Status: DC | PRN
  Administered 2020-04-27: .625 mg via INTRAVENOUS

## 2020-04-27 MED ORDER — SUGAMMADEX SODIUM 200 MG/2ML IV SOLN
INTRAVENOUS | Status: DC | PRN
  Administered 2020-04-27: 200 mg via INTRAVENOUS

## 2020-04-27 MED ORDER — FENTANYL CITRATE (PF) 100 MCG/2ML IJ SOLN
INTRAMUSCULAR | Status: DC | PRN
Start: 2020-04-27 — End: 2020-04-27
  Administered 2020-04-27 (×2): 50 ug via INTRAVENOUS
  Administered 2020-04-27: 100 ug via INTRAVENOUS

## 2020-04-27 MED ORDER — EPHEDRINE SULFATE-NACL 50-0.9 MG/10ML-% IV SOSY
PREFILLED_SYRINGE | INTRAVENOUS | Status: DC | PRN
  Administered 2020-04-27: 10 mg via INTRAVENOUS

## 2020-04-27 MED ORDER — STERILE WATER FOR IRRIGATION IR SOLN
Status: DC | PRN
  Administered 2020-04-27: 1000 mL

## 2020-04-27 MED ORDER — LACTATED RINGERS IR SOLN
Status: DC | PRN
  Administered 2020-04-27: 1000 mL

## 2020-04-27 MED ORDER — HYDROMORPHONE HCL 1 MG/ML IJ SOLN
0.2500 mg | INTRAMUSCULAR | Status: DC | PRN
  Administered 2020-04-27 (×2): 0.5 mg via INTRAVENOUS

## 2020-04-27 MED ORDER — DEXAMETHASONE SODIUM PHOSPHATE 10 MG/ML IJ SOLN
INTRAMUSCULAR | Status: DC | PRN
  Administered 2020-04-27: 10 mg via INTRAVENOUS

## 2020-04-27 MED ORDER — ROCURONIUM BROMIDE 10 MG/ML (PF) SYRINGE
PREFILLED_SYRINGE | INTRAVENOUS | Status: AC
  Filled 2020-04-27: qty 10

## 2020-04-27 SURGICAL SUPPLY — 71 items
APPLIER CLIP ROT 10 11.4 M/L (STAPLE)
APPLIER CLIP ROT 13.4 12 LRG (CLIP)
BLADE SURG 15 STRL LF DISP TIS (BLADE) ×1 IMPLANT
BLADE SURG 15 STRL SS (BLADE) ×1
CABLE HIGH FREQUENCY MONO STRZ (ELECTRODE) ×2 IMPLANT
CHLORAPREP W/TINT 26 (MISCELLANEOUS) ×4 IMPLANT
CLIP APPLIE ROT 10 11.4 M/L (STAPLE) IMPLANT
CLIP APPLIE ROT 13.4 12 LRG (CLIP) IMPLANT
CLIP SUT LAPRA TY ABSORB (SUTURE) ×6 IMPLANT
COVER WAND RF STERILE (DRAPES) IMPLANT
DECANTER SPIKE VIAL GLASS SM (MISCELLANEOUS) ×2 IMPLANT
DERMABOND ADVANCED (GAUZE/BANDAGES/DRESSINGS) ×1
DERMABOND ADVANCED .7 DNX12 (GAUZE/BANDAGES/DRESSINGS) ×1 IMPLANT
DEVICE SUT QUICK LOAD TK 5 (STAPLE) ×4 IMPLANT
DEVICE SUT TI-KNOT TK 5X26 (MISCELLANEOUS) ×2 IMPLANT
DEVICE SUTURE ENDOST 10MM (ENDOMECHANICALS) ×2 IMPLANT
DISSECTOR BLUNT TIP ENDO 5MM (MISCELLANEOUS) IMPLANT
DRAIN PENROSE 0.25X18 (DRAIN) ×2 IMPLANT
GAUZE 4X4 16PLY RFD (DISPOSABLE) IMPLANT
GLOVE SURG SYN 7.5  E (GLOVE) ×1
GLOVE SURG SYN 7.5 E (GLOVE) ×1 IMPLANT
GOWN STRL REUS W/TWL XL LVL3 (GOWN DISPOSABLE) ×8 IMPLANT
KIT BASIN OR (CUSTOM PROCEDURE TRAY) ×2 IMPLANT
KIT GASTRIC LAVAGE 34FR ADT (SET/KITS/TRAYS/PACK) ×2 IMPLANT
KIT TURNOVER KIT A (KITS) IMPLANT
MARKER SKIN DUAL TIP RULER LAB (MISCELLANEOUS) ×2 IMPLANT
MAT PREVALON FULL STRYKER (MISCELLANEOUS) ×2 IMPLANT
NEEDLE SPNL 22GX3.5 QUINCKE BK (NEEDLE) ×2 IMPLANT
PACK CARDIOVASCULAR III (CUSTOM PROCEDURE TRAY) ×2 IMPLANT
PENCIL SMOKE EVACUATOR (MISCELLANEOUS) IMPLANT
RELOAD 45 VASCULAR/THIN (ENDOMECHANICALS) ×4 IMPLANT
RELOAD ENDO STITCH 2.0 (ENDOMECHANICALS) ×11
RELOAD STAPLE TA45 3.5 REG BLU (ENDOMECHANICALS) ×2 IMPLANT
RELOAD STAPLER BLUE 60MM (STAPLE) ×6 IMPLANT
RELOAD STAPLER GOLD 60MM (STAPLE) ×1 IMPLANT
RELOAD STAPLER WHITE 60MM (STAPLE) IMPLANT
SCISSORS LAP 5X45 EPIX DISP (ENDOMECHANICALS) ×2 IMPLANT
SEALANT SURGICAL APPL DUAL CAN (MISCELLANEOUS) ×2 IMPLANT
SET IRRIG TUBING LAPAROSCOPIC (IRRIGATION / IRRIGATOR) ×2 IMPLANT
SET TUBE SMOKE EVAC HIGH FLOW (TUBING) ×2 IMPLANT
SHEARS HARMONIC ACE PLUS 45CM (MISCELLANEOUS) ×2 IMPLANT
SLEEVE ADV FIXATION 12X100MM (TROCAR) ×6 IMPLANT
SLEEVE ADV FIXATION 5X100MM (TROCAR) ×2 IMPLANT
SLEEVE XCEL OPT CAN 5 100 (ENDOMECHANICALS) IMPLANT
SOL ANTI FOG 6CC (MISCELLANEOUS) ×1 IMPLANT
SOLUTION ANTI FOG 6CC (MISCELLANEOUS) ×1
STAPLER ECHELON LONG 60 440 (INSTRUMENTS) ×2 IMPLANT
STAPLER RELOAD BLUE 60MM (STAPLE) ×12
STAPLER RELOAD GOLD 60MM (STAPLE) ×2
STAPLER RELOAD WHITE 60MM (STAPLE)
SURGILUBE 2OZ TUBE FLIPTOP (MISCELLANEOUS) ×2 IMPLANT
SUT MNCRL AB 4-0 PS2 18 (SUTURE) ×2 IMPLANT
SUT RELOAD ENDO STITCH 2 48X1 (ENDOMECHANICALS) ×7
SUT RELOAD ENDO STITCH 2.0 (ENDOMECHANICALS) ×4
SUT SURGIDAC NAB ES-9 0 48 120 (SUTURE) ×4 IMPLANT
SUT VIC AB 2-0 SH 27 (SUTURE) ×3
SUT VIC AB 2-0 SH 27X BRD (SUTURE) ×3 IMPLANT
SUTURE RELOAD END STTCH 2 48X1 (ENDOMECHANICALS) ×7 IMPLANT
SUTURE RELOAD ENDO STITCH 2.0 (ENDOMECHANICALS) ×4 IMPLANT
SYR 10ML ECCENTRIC (SYRINGE) ×2 IMPLANT
SYR 20ML LL LF (SYRINGE) ×4 IMPLANT
SYR 50ML LL SCALE MARK (SYRINGE) IMPLANT
TOWEL OR 17X26 10 PK STRL BLUE (TOWEL DISPOSABLE) ×2 IMPLANT
TRAY FOLEY MTR SLVR 16FR STAT (SET/KITS/TRAYS/PACK) ×2 IMPLANT
TROCAR ADV FIXATION 11X100MM (TROCAR) IMPLANT
TROCAR ADV FIXATION 12X100MM (TROCAR) ×2 IMPLANT
TROCAR ADV FIXATION 5X100MM (TROCAR) ×2 IMPLANT
TROCAR BLADELESS OPT 12M 100M (ENDOMECHANICALS) IMPLANT
TROCAR BLADELESS OPT 5 100 (ENDOMECHANICALS) IMPLANT
TUBING CONNECTING 10 (TUBING) IMPLANT
TUBING ENDO SMARTCAP (MISCELLANEOUS) ×2 IMPLANT

## 2020-04-27 NOTE — Progress Notes (Signed)
Discussed post op day goals with patient including ambulation, IS, diet progression, pain, and nausea control.  Questions answered. 

## 2020-04-27 NOTE — Op Note (Signed)
PATIENT:   Lindsey Whitaker DOB:   Apr 26, 1966 MRN:   [id number]  DATE OF PROCEDURE: 04/27/2020                   FACILITY:  [facility]  OPERATIVE REPORT  PREOPERATIVE DIAGNOSIS:  Morbid obesity.  POSTOPERATIVE DIAGNOSIS:  Morbid obesity (weight 207, BMI of 40.9).  PROCEDURE:  Laparoscopic Roux-en-Y gastric bypass, antecolic, antegastric (intraoperative upper endoscopy by L. Kinsinger)  SURGEON:  Sandria Bales. Ezzard Standing, MD  FIRST ASSISTANTHollice Espy, MD  ANESTHESIA:  General endotracheal.  Anesthesiologist: Eilene Ghazi, MD CRNA: Theodosia Quay, CRNA; Jorene Minors, CRNA  General  ESTIMATED BLOOD LOSS:  Minimal.  LOCAL ANESTHESIA:  30 cc of 1/4% Marcaine + 20 cc of Exparel  COMPLICATIONS:  None.  INDICATION FOR SURGERY:  Lindsey Whitaker is a 54 y.o. white female who sees Mitzie Na, [place], NP as her primary care doctor.  She has completed our preoperative bariatric program and now comes for a laparoscopic Roux-en-Y gastric bypass.  The indications, potential complications of surgery were explained to the patient.  Potential complications of the surgery include, but are not limited to, bleeding, infection, DVT, open surgery, and long-term nutritional consequences.  OPERATIVE NOTE:  The patient taken to room #1 at [facility] where Ms. Dellis Anes underwent a general endotracheal anesthetic, supervised by Anesthesiologist: Eilene Ghazi, MD CRNA: Theodosia Quay, CRNA; Jorene Minors, CRNA.  The patient was given 2 g of cefotetan at the beginning of the procedure.  A time-out was held and surgical checklist run.  The abdomen was prepped with ChloraPrep and sterilely draped.  I accessed the abdominal cavity through the left upper quadrant using a 12 mm Optiview trocar.  I placed 6 additional trocars: 5 mm subxiphoid, 12 mm right subcostal, 12 mm right paramedian, 12 mm left paramedian, 5 mm lateral subcostal, and a 5 mm below to the right of the umbilicus.  I placed a block along both  abdominal side walls (20 cc per side) using a mixture of Exparel and Marcaine.  The abdomen was insufflated and abdominal exploration carried out.  Right and left lobes of liver unremarkable.  The stomach that I could see was unremarkable.  The patient had a moderate amount of greater omentum which draped over the bowel.  I was able to push the omentum and transverse colon up and identified the ligament of Treitz to start the operation.  I measured 40 cm of the jejunum, starting at the ligament of Tritz, and divided the jejunum with a white load of 45 mm Ethicon Endo-GIA stapler.  I divided a short length into the mesentery.  I measured 100 cm of jejunum for the future gastric limb.  I put a Penrose drain on the future gastric limb of the jejunum.  I then did a side-to-side jejunojejunostomy.   I used a 45 mm white load of the Ethicon Endo-GIA stapler for the anastomosis.  I closed the enterotomy with 2 running 2-0 Vicryl sutures.  I tested the JJ anastomosis with an alligator forceps and then covered this with Tisseel.  I closed the mesenteric defect with a running 2-0 silk suture with a Laparo-tye on each end.  I then divided the omentum with a Harmonic Scalpel.  I positioned the patient in reverse Trendelenburg and placed the liver retractor, which was introduced into the peritoneal cavity through a subxiphoid 5 mm trocar puncture, under the left lobe of the liver.  I then identified the gastroesophageal junction.  He had  a small to moderate HH hernia.  I reduced the esophagogastric junction.  I dissected posterior to the esophagogastric junction and identified the right crus and left crus posteriorly.  I placed two 0 Ethibond suture posteriorly and used a TyeKnot to cinch the suture.  I then started my gastric dissection of the Roux.  I went to the left at the angle of Hiss and made a window at the left side of the esophago-gastric junction for a target as my dissection.  I then went on the  lesser curve of the stomach, measured 5 cm from the gastroesophageal junction down the lesser curve and dissected into the lesser sac from the lesser curvature side of the stomach.  I did the first firing of a 60 mm gold load Ethicon Eschelon stapler and then did 4 firings of the 60 mm blue load Ethicon Eschelon stapler.  This created a gastric pouch approximately 5 cm in length and 3 cm in width.  There was no bleeding from either the pouch or the stomach remnant site.  I placed Tisseel on the pouch side along the new greater curvature.  I over sewed the gastric remnant with a locking 2-0 Vicryl suture with a Laparo-tye on each end..  I then brought the jejunum ante-colic, ante-gastric up to the new stomach pouch and placed a posterior running 2-0 Vicryl suture.  I then made an enterotomy into the stomach using the Ewald as a back stop and an enterotomy into the jejunum.  I did a stapled side-to-side gastrojejunal anastomosis using these two enterotomies with a 45 mm blue load of the Ethicon Endo GIA stapler.  I tried to create a 2.5 cm gastrojejunal anastomosis.  I closed the enterotomy with a 2 running 2-0 Vicryl sutures.  I had to place two additional 2-0 vicryl sutures I passed the Ewald tube through the gastrojejunal anastomosis and then did an anterior Connell suture running of 2-0 Vicryl suture for the anterior layer of the gastrojejunostomy.  The Ewald tube was then removed without difficulty.  I then closed the [place] defect with a figure-of-eight 2-0 silk suture between the mesentery of the transverse colon and the mesentery of the distal jejunum.  Dr. Sheliah Hatch then scrubbed out and did an intraoperative upper endoscopy.  He identified the esophagogastric junction about 39 cm, the gastrojejunal anastomosis about 45 cm.  I clamped off the small bowel.  He insufflated air and I flooded the abdomen with saline. There was no bubbling or evidence of air leak.  He then withdrew the scope and he  will dictate that portion of the operation.  I have a surgeon as a first assist to retract, expose, and assist on this difficult operation.  I then re-inspected the anastomoses, sucked out the saline, placed Tisseel over the stomach pouch and gastrojejunal anastomosis.   The liver retractor was removed.  The trocars were removed.  There was no bleeding at any trocar site.  I infiltrated 10 cc of the remaining local at the trocar sites.  The skin at each trocar site was closed with a 4-0 Monocryl suture.  After the skin incisions were closed with sutures they were painted with DermaBond.  The sponge and needle count were correct at the end of the case.  The patient tolerated the procedure well, was transported to the recovery room in good condition.   Ovidio Kin, MD, [facility] Surgery Office phone:  [phone]

## 2020-04-27 NOTE — Progress Notes (Signed)
PHARMACY CONSULT FOR:  Risk Assessment for Post-Discharge VTE Following Bariatric Surgery  Post-Discharge VTE Risk Assessment: This patient's probability of 30-day post-discharge VTE is increased due to the factors marked:   Female    Age >/=60 years    BMI >/=50 kg/m2    CHF    Dyspnea at Rest    Paraplegia   X Non-gastric-band surgery    Operation Time >/=3 hr    Return to OR     Length of Stay >/= 3 d   Hx of VTE   Hypercoagulable condition   Significant venous stasis   Predicted probability of 30-day post-discharge VTE: 0.16%, mild  Other patient-specific factors to consider:  Recommendation for Discharge: No pharmacologic prophylaxis post-discharge   Lindsey Whitaker is a 54 y.o. female who underwent  laparoscopic Roux-en-Y gastric bypass on 04/27/2020  Case start: 1005 Case end: 1252   Allergies  Allergen Reactions  . Shellfish Allergy Hives  . [place] Other (See Comments)    Patient Measurements: Weight: 93.1 kg (205 lb 3.2 oz) Body mass index is 41.45 kg/m.  No results for input(s): WBC, HGB, HCT, PLT, APTT, CREATININE, LABCREA, CREATININE, CREAT24HRUR, MG, PHOS, ALBUMIN, PROT, ALBUMIN, AST, ALT, ALKPHOS, BILITOT, BILIDIR, IBILI in the last 72 hours. Estimated Creatinine Clearance: 68.2 mL/min (by C-G formula based on SCr of 0.93 mg/dL).    Past Medical History:  Diagnosis Date  . Carpal tunnel syndrome, bilateral   . Depression   . GERD (gastroesophageal reflux disease)   . PONV (postoperative nausea and vomiting)   . Sleep apnea      Medications Prior to Admission  Medication Sig Dispense Refill Last Dose  . buPROPion (WELLBUTRIN SR) 150 MG 12 hr tablet Take 150 mg by mouth 2 (two) times daily.   04/27/2020 at 0545  . famotidine (PEPCID) 20 MG tablet Take 20 mg by mouth 2 (two) times daily.   04/27/2020 at 0545  . traZODone (DESYREL) 50 MG tablet Take 50 mg by mouth at bedtime.   04/26/2020 at Unknown time    Lynann Beaver PharmD,  BCPS Clinical Pharmacist [facility] main pharmacy [phone] 04/27/2020 9:07 AM

## 2020-04-27 NOTE — Anesthesia Preprocedure Evaluation (Signed)
Anesthesia Evaluation  Patient identified by MRN, date of birth, ID band Patient awake    Reviewed: Allergy & Precautions, NPO status , Patient's Chart, lab work & pertinent test results  History of Anesthesia Complications (+) PONV  Airway Mallampati: II  TM Distance: >3 FB Neck ROM: Full    Dental no notable dental hx.    Pulmonary sleep apnea ,    Pulmonary exam normal breath sounds clear to auscultation       Cardiovascular negative cardio ROS Normal cardiovascular exam Rhythm:Regular Rate:Normal     Neuro/Psych negative neurological ROS  negative psych ROS   GI/Hepatic Neg liver ROS, hiatal hernia, GERD  ,  Endo/Other  negative endocrine ROS  Renal/GU negative Renal ROS  negative genitourinary   Musculoskeletal negative musculoskeletal ROS (+)   Abdominal   Peds negative pediatric ROS (+)  Hematology negative hematology ROS (+)   Anesthesia Other Findings   Reproductive/Obstetrics negative OB ROS                             Anesthesia Physical Anesthesia Plan  ASA: II  Anesthesia Plan: General   Post-op Pain Management:    Induction: Intravenous and Rapid sequence  PONV Risk Score and Plan: 4 or greater and Ondansetron, Dexamethasone, Midazolam, Scopolamine patch - Pre-op and Treatment may vary due to age or medical condition  Airway Management Planned: Oral ETT  Additional Equipment:   Intra-op Plan:   Post-operative Plan: Extubation in OR  Informed Consent: I have reviewed the patients History and Physical, chart, labs and discussed the procedure including the risks, benefits and alternatives for the proposed anesthesia with the patient or authorized representative who has indicated his/her understanding and acceptance.     Dental advisory given  Plan Discussed with: CRNA and Surgeon  Anesthesia Plan Comments:         Anesthesia Quick Evaluation

## 2020-04-27 NOTE — Interval H&P Note (Signed)
History and Physical Interval Note:  04/27/2020 9:06 AM  Lindsey Whitaker  has presented today for surgery, with the diagnosis of MORBID OBESITY.  The various methods of treatment have been discussed with the patient and family.  Her fiancee Leta Jungling is at home with her mother.    After consideration of risks, benefits and other options for treatment, the patient has consented to  Procedure(s): LAPAROSCOPIC ROUX-EN-Y GASTRIC BYPASS WITH UPPER ENDOSCOPY, HIATAL HERNIA REPAIR (N/A) as a surgical intervention.  The patient's history has been reviewed, patient examined, no change in status, stable for surgery.  I have reviewed the patient's chart and labs.  Questions were answered to the patient's satisfaction.     Kandis Cocking

## 2020-04-27 NOTE — Anesthesia Procedure Notes (Signed)
Procedure Name: Intubation Date/Time: 04/27/2020 9:57 AM Performed by: Lavina Hamman, CRNA Pre-anesthesia Checklist: Patient identified, Emergency Drugs available, Suction available, Patient being monitored and Timeout performed Patient Re-evaluated:Patient Re-evaluated prior to induction Oxygen Delivery Method: Circle system utilized Preoxygenation: Pre-oxygenation with 100% oxygen Induction Type: IV induction Ventilation: Mask ventilation without difficulty Laryngoscope Size: Mac and 3 Grade View: Grade I Tube type: Oral Tube size: 7.5 mm Number of attempts: 1 Airway Equipment and Method: Stylet Placement Confirmation: ETT inserted through vocal cords under direct vision,  positive ETCO2,  CO2 detector and breath sounds checked- equal and bilateral Secured at: 22 cm Tube secured with: Tape Dental Injury: Teeth and Oropharynx as per pre-operative assessment  Comments: ATOI by EMS student [place].

## 2020-04-27 NOTE — Op Note (Signed)
Preoperative diagnosis: Roux-en-Y gastric bypass  Postoperative diagnosis: Same   Procedure: Upper endoscopy   Surgeon: Feliciana Rossetti, M.D.  Anesthesia: Gen.   Indications for procedure: This patient was undergoing a Roux-en-Y gastric bypass.   Description of procedure: The endoscopy was placed in the mouth and into the oropharynx and under endoscopic vision it was advanced to the esophagogastric junction. The pouch was insufflated and no bleeding or bubbles were seen. The GEJ was identified at 39 cm from the teeth. The anastomosis was seen at 46 cm. No bleeding or leaks were detected. The scope was withdrawn without difficulty.   Feliciana Rossetti, M.D. General, Bariatric, & Minimally Invasive Surgery [facility]

## 2020-04-27 NOTE — Transfer of Care (Signed)
Immediate Anesthesia Transfer of Care Note  Patient: Lindsey Whitaker  Procedure(s) Performed: Procedure(s): LAPAROSCOPIC ROUX-EN-Y GASTRIC BYPASS WITH UPPER ENDOSCOPY, HIATAL HERNIA REPAIR (N/A)  Patient Location: PACU  Anesthesia Type:General  Level of Consciousness:  sedated, patient cooperative and responds to stimulation  Airway & Oxygen Therapy:Patient Spontanous Breathing and Patient connected to face mask oxgen  Post-op Assessment:  Report given to PACU RN and Post -op Vital signs reviewed and stable  Post vital signs:  Reviewed and stable  Last Vitals:  Vitals:   04/27/20 0739  BP: 131/67  Pulse: 88  Resp: 16  Temp: 36.8 C  SpO2: 99%    Complications: No apparent anesthesia complications

## 2020-04-28 ENCOUNTER — Encounter (HOSPITAL_COMMUNITY): Payer: Self-pay | Admitting: Surgery

## 2020-04-28 LAB — CBC WITH DIFFERENTIAL/PLATELET
Abs Immature Granulocytes: 0.1 10*3/uL — ABNORMAL HIGH (ref 0.00–0.07)
Basophils Absolute: 0 10*3/uL (ref 0.0–0.1)
Basophils Relative: 0 %
Eosinophils Absolute: 0 10*3/uL (ref 0.0–0.5)
Eosinophils Relative: 0 %
HCT: 38.4 % (ref 36.0–46.0)
Hemoglobin: 12.4 g/dL (ref 12.0–15.0)
Immature Granulocytes: 1 %
Lymphocytes Relative: 7 %
Lymphs Abs: 1.3 10*3/uL (ref 0.7–4.0)
MCH: 28 pg (ref 26.0–34.0)
MCHC: 32.3 g/dL (ref 30.0–36.0)
MCV: 86.7 fL (ref 80.0–100.0)
Monocytes Absolute: 1.3 10*3/uL — ABNORMAL HIGH (ref 0.1–1.0)
Monocytes Relative: 7 %
Neutro Abs: 16 10*3/uL — ABNORMAL HIGH (ref 1.7–7.7)
Neutrophils Relative %: 85 %
Platelets: 295 10*3/uL (ref 150–400)
RBC: 4.43 MIL/uL (ref 3.87–5.11)
RDW: 13.6 % (ref 11.5–15.5)
WBC: 18.7 10*3/uL — ABNORMAL HIGH (ref 4.0–10.5)
nRBC: 0 % (ref 0.0–0.2)

## 2020-04-28 NOTE — Anesthesia Postprocedure Evaluation (Signed)
Anesthesia Post Note  Patient: Lindsey Whitaker  Procedure(s) Performed: LAPAROSCOPIC ROUX-EN-Y GASTRIC BYPASS WITH UPPER ENDOSCOPY, HIATAL HERNIA REPAIR (N/A )     Patient location during evaluation: PACU Anesthesia Type: General Level of consciousness: awake and alert Pain management: pain level controlled Vital Signs Assessment: post-procedure vital signs reviewed and stable Respiratory status: spontaneous breathing, nonlabored ventilation, respiratory function stable and patient connected to nasal cannula oxygen Cardiovascular status: blood pressure returned to baseline and stable Postop Assessment: no apparent nausea or vomiting Anesthetic complications: no   No complications documented.  Last Vitals:  Vitals:   04/28/20 0114 04/28/20 0545  BP: (!) 128/108 127/66  Pulse: 67 83  Resp: 16 16  Temp: 36.7 C 36.6 C  SpO2: 98% 98%    Last Pain:  Vitals:   04/28/20 0812  TempSrc:   PainSc: 6                  [place] S

## 2020-04-28 NOTE — Progress Notes (Signed)
[facility] Surgery Office:  [phone] General Surgery Progress Note   LOS: 1 day  POD -  1 Day Post-Op  Assessment and Plan: 1.  LAPAROSCOPIC ROUX-EN-Y GASTRIC BYPASS WITH UPPER ENDOSCOPY, HIATAL HERNIA REPAIR - 04/27/2020 Lindsey Whitaker  For morbid obesity (weight 207, BMI of 40.9).  She needs to go a little slower with her liquids.  She has done well ambulating.  2.  GERD (GASTROESOPHAGEAL REFLUX DISEASE) 3.  OSA ON CPAP (G47.33)             (seen through [facility]) 4. HTN 5.  DVT prophylaxis - Lovenox  Active Problems:   Morbid obesity (HCC)  Subjective:  She has taken fluid a little too much and little too fast.  She feels bloated and mildly nauseated.  Objective:   Vitals:   04/28/20 0114 04/28/20 0545  BP: (!) 128/108 127/66  Pulse: 67 83  Resp: 16 16  Temp: 98 F (36.7 C) 97.8 F (36.6 C)  SpO2: 98% 98%     Intake/Output from previous day:  09/28 0701 - 09/29 0700 In: 3902.1 [P.O.:1040; I.V.:2862.1] Out: 2450 [Urine:2350; Blood:100]  Intake/Output this shift:  Total I/O In: 330 [P.O.:330] Out: 700 [Urine:700]   Physical Exam:   General: WN obese WF who is alert and oriented.    HEENT: Normal. Pupils equal. .   Lungs:  Clear.  IS = 1,500 cc   Abdomen: Soft but rare BS.   Wound: Clean   Lab Results:    Recent Labs    04/27/20 1542 04/28/20 0324  WBC  --  18.7*  HGB 13.2 12.4  HCT 41.5 38.4  PLT  --  295    BMET  No results for input(s): NA, K, CL, CO2, GLUCOSE, BUN, CREATININE, CALCIUM in the last 72 hours.  PT/INR  No results for input(s): LABPROT, INR in the last 72 hours.  ABG  No results for input(s): PHART, HCO3 in the last 72 hours.  Invalid input(s): PCO2, PO2   Studies/Results:  No results found.   Anti-infectives:   Anti-infectives (From admission, onward)   Start     Dose/Rate Route Frequency Ordered Stop   04/27/20 0745  cefoTEtan (CEFOTAN) 2 g in sodium chloride 0.9 % 100 mL IVPB        2 g 200 mL/hr over 30 Minutes  Intravenous On call to O.R. 04/27/20 0732 04/27/20 0941      Ovidio Kin, MD, [facility] Surgery Office: [phone] 04/28/2020

## 2020-04-28 NOTE — Progress Notes (Signed)
Nutrition Note  RD consulted for diet education for patient s/p bariatric surgery. Bariatric nurse coordinator providing education.  If nutrition issues arise, please consult RD.   Rella Egelston, MS, RD, LDN Inpatient Clinical Dietitian Contact information available via Amion  

## 2020-04-28 NOTE — Progress Notes (Signed)
Patient alert and oriented, Post op day 1.  Provided support and encouragement.  Encouraged pulmonary toilet, ambulation and small sips of liquids.  Discussed fluid intake and slowing down on the amount to reduce discomfort.  All questions answered.  Will continue to monitor.

## 2020-04-29 LAB — CBC WITH DIFFERENTIAL/PLATELET
Abs Immature Granulocytes: 0.03 10*3/uL (ref 0.00–0.07)
Basophils Absolute: 0.1 10*3/uL (ref 0.0–0.1)
Basophils Relative: 0 %
Eosinophils Absolute: 0 10*3/uL (ref 0.0–0.5)
Eosinophils Relative: 0 %
HCT: 41 % (ref 36.0–46.0)
Hemoglobin: 12.8 g/dL (ref 12.0–15.0)
Immature Granulocytes: 0 %
Lymphocytes Relative: 25 %
Lymphs Abs: 3.5 10*3/uL (ref 0.7–4.0)
MCH: 27.6 pg (ref 26.0–34.0)
MCHC: 31.2 g/dL (ref 30.0–36.0)
MCV: 88.6 fL (ref 80.0–100.0)
Monocytes Absolute: 1.4 10*3/uL — ABNORMAL HIGH (ref 0.1–1.0)
Monocytes Relative: 10 %
Neutro Abs: 8.9 10*3/uL — ABNORMAL HIGH (ref 1.7–7.7)
Neutrophils Relative %: 65 %
Platelets: 299 10*3/uL (ref 150–400)
RBC: 4.63 MIL/uL (ref 3.87–5.11)
RDW: 14.3 % (ref 11.5–15.5)
WBC: 13.8 10*3/uL — ABNORMAL HIGH (ref 4.0–10.5)
nRBC: 0 % (ref 0.0–0.2)

## 2020-04-29 MED ORDER — OXYCODONE HCL 5 MG PO TABS: 5.0000 mg | ORAL_TABLET | Freq: Four times a day (QID) | ORAL | 0 refills | Status: AC | PRN

## 2020-04-29 NOTE — Discharge Summary (Signed)
Physician Discharge Summary  Patient ID:  Lindsey Whitaker  MRN: [id number]  DOB/AGE: 55/08/1965 55 y.o.  Admit date: 04/27/2020 Discharge date: 04/29/2020  Discharge Diagnoses:  1.  OBESITY, MORBID, BMI 40.0-49.9 (E66.01)         (weight 207, BMI of 40.9). 2.  GERD (GASTROESOPHAGEAL REFLUX DISEASE) with Hiatal hernia 3.  OSA ON CPAP (G47.33)             (seen through [facility]) 4. 2016 - lumbar fusion for scoliosis 5. HTN  Active Problems:   Morbid obesity (HCC)  Operation: Procedure(s):  LAPAROSCOPIC ROUX-EN-Y GASTRIC BYPASS WITH UPPER ENDOSCOPY, HIATAL HERNIA REPAIR on 04/27/2020 [facility]  Discharged Condition: good  Hospital Course: Lindsey Whitaker is an 54 y.o. female whose primary care physician is Linton Rump, NP and who was admitted 04/27/2020 with a chief complaint of morbid obesity.   She was brought to the operating room on 04/27/2020 and underwent  LAPAROSCOPIC ROUX-EN-Y GASTRIC BYPASS WITH UPPER ENDOSCOPY, HIATAL HERNIA REPAIR.   She drank a little too much fluid early and felt bloated.  She has backed off on the liquids and is doing better this AM. She has some right elbow pain for unclear reasons. She is ready to go home.  The discharge instructions were reviewed with the patient.  Consults: None  Significant Diagnostic Studies: Results for orders placed or performed during the hospital encounter of 04/27/20  Hemoglobin and hematocrit, blood  Result Value Ref Range   Hemoglobin 13.2 12.0 - 15.0 g/dL   HCT 93.2 36 - 46 %  CBC WITH DIFFERENTIAL  Result Value Ref Range   WBC 18.7 (H) 4.0 - 10.5 K/uL   RBC 4.43 3.87 - 5.11 MIL/uL   Hemoglobin 12.4 12.0 - 15.0 g/dL   HCT 67.1 36 - 46 %   MCV 86.7 80.0 - 100.0 fL   MCH 28.0 26.0 - 34.0 pg   MCHC 32.3 30.0 - 36.0 g/dL   RDW 24.5 80.9 - 98.3 %   Platelets 295 150 - 400 K/uL   nRBC 0.0 0.0 - 0.2 %   Neutrophils Relative % 85 %   Neutro Abs 16.0 (H) 1.7 - 7.7 K/uL   Lymphocytes Relative 7 %   Lymphs Abs  1.3 0.7 - 4.0 K/uL   Monocytes Relative 7 %   Monocytes Absolute 1.3 (H) 0 - 1 K/uL   Eosinophils Relative 0 %   Eosinophils Absolute 0.0 0 - 0 K/uL   Basophils Relative 0 %   Basophils Absolute 0.0 0 - 0 K/uL   Immature Granulocytes 1 %   Abs Immature Granulocytes 0.10 (H) 0.00 - 0.07 K/uL  CBC with Differential  Result Value Ref Range   WBC 13.8 (H) 4.0 - 10.5 K/uL   RBC 4.63 3.87 - 5.11 MIL/uL   Hemoglobin 12.8 12.0 - 15.0 g/dL   HCT 38.2 36 - 46 %   MCV 88.6 80.0 - 100.0 fL   MCH 27.6 26.0 - 34.0 pg   MCHC 31.2 30.0 - 36.0 g/dL   RDW 50.5 39.7 - 67.3 %   Platelets 299 150 - 400 K/uL   nRBC 0.0 0.0 - 0.2 %   Neutrophils Relative % 65 %   Neutro Abs 8.9 (H) 1.7 - 7.7 K/uL   Lymphocytes Relative 25 %   Lymphs Abs 3.5 0.7 - 4.0 K/uL   Monocytes Relative 10 %   Monocytes Absolute 1.4 (H) 0 - 1 K/uL   Eosinophils Relative 0 %  Eosinophils Absolute 0.0 0 - 0 K/uL   Basophils Relative 0 %   Basophils Absolute 0.1 0 - 0 K/uL   Immature Granulocytes 0 %   Abs Immature Granulocytes 0.03 0.00 - 0.07 K/uL  ABO/Rh  Result Value Ref Range   ABO/RH(D)      A POS Performed at [facility], [address], [place], [place] [address]     No results found.  Discharge Exam:  Vitals:   04/28/20 2151 04/29/20 0531  BP: 129/67 132/69  Pulse: 84 75  Resp: 17 16  Temp: 99 F (37.2 C) 99.4 F (37.4 C)  SpO2: 97% 95%    General: Obese WF who is alert and generally healthy appearing.  Lungs: Clear to auscultation and symmetric breath sounds. Heart:  RRR. No murmur or rub. Abdomen: Soft. No mass. No hernia.  Her incisions look good. Her LUQ incision is her most uncomfortable.  Discharge Medications:   Allergies as of 04/29/2020      Reactions   Shellfish Allergy Hives   [place]ch Other (See Comments)      Medication List    TAKE these medications   buPROPion 150 MG 12 hr tablet Commonly known as: WELLBUTRIN SR Take 150 mg by mouth 2 (two) times  daily.   famotidine 20 MG tablet Commonly known as: PEPCID Take 20 mg by mouth 2 (two) times daily.   oxyCODONE 5 MG immediate release tablet Commonly known as: Oxy IR/ROXICODONE Take 1 tablet (5 mg total) by mouth every 6 (six) hours as needed for severe pain.   traZODone 50 MG tablet Commonly known as: DESYREL Take 50 mg by mouth at bedtime.       Disposition: Discharge disposition: 01-Home or Self Care       Discharge Instructions    Ambulate hourly while awake   Complete by: As directed    Call MD for:  difficulty breathing, headache or visual disturbances   Complete by: As directed    Call MD for:  persistant dizziness or light-headedness   Complete by: As directed    Call MD for:  persistant nausea and vomiting   Complete by: As directed    Call MD for:  redness, tenderness, or signs of infection (pain, swelling, redness, odor or green/yellow discharge around incision site)   Complete by: As directed    Call MD for:  severe uncontrolled pain   Complete by: As directed    Call MD for:  temperature >101 F   Complete by: As directed    Diet bariatric full liquid   Complete by: As directed    Incentive spirometry   Complete by: As directed    Perform hourly while awake       Follow-up Information    Ovidio Kin, MD. Go on 05/19/2020.   Specialty: General Surgery Why: at 915 am.  Please arrive 15 minutes prior to appointment time.  Thank you Contact information: [address] [place] [address] [phone]        Hedda Slade, PA-C. Go on 06/08/2020.   Specialty: General Surgery Why: at 130 pm.  Please arrive 15 minutes prior to appointment time.  Thank you Contact information: [address] [place] [address] [phone]                Signed: Ovidio Kin, M.D., [facility] Surgery Office:  [phone]  04/29/2020, 7:51 AM

## 2020-04-29 NOTE — Progress Notes (Signed)
Pt alert and oriented. D/C instructions given by Vonzell Schlatter, RN. Pt d/cd to home.

## 2020-04-29 NOTE — Progress Notes (Signed)
Patient alert and oriented, Post op day 2.  Provided support and encouragement.  Encouraged pulmonary toilet, ambulation and small sips of liquids. Abdominal binder ordered.  All questions answered.  Will continue to monitor.

## 2020-04-29 NOTE — Discharge Instructions (Signed)
   GASTRIC BYPASS/SLEEVE  Home Care Instructions   These instructions are to help you care for yourself when you go home.  Call: If you have any problems. . Call [phone] and ask for the surgeon on call . If you need immediate help, come to the ER at [facility].  . Tell the ER staff that you are a new post-op gastric bypass or gastric sleeve patient   Signs and symptoms to report: . Severe vomiting or nausea o If you cannot keep down clear liquids for longer than 1 day, call your surgeon  . Abdominal pain that does not get better after taking your pain medication . Fever over 100.4 F with chills . Heart beating over 100 beats a minute . Shortness of breath at rest . Chest pain .  Redness, swelling, drainage, or foul odor at incision (surgical) sites .  If your incisions open or pull apart . Swelling or pain in calf (lower leg) . Diarrhea (Loose bowel movements that happen often), frequent watery, uncontrolled bowel movements . Constipation, (no bowel movements for 3 days) if this happens: Pick one o Milk of Magnesia, 2 tablespoons by mouth, 3 times a day for 2 days if needed o Stop taking Milk of Magnesia once you have a bowel movement o Call your doctor if constipation continues Or o Miralax  (instead of Milk of Magnesia) following the label instructions o Stop taking Miralax once you have a bowel movement o Call your doctor if constipation continues . Anything you think is not normal   Normal side effects after surgery: . Unable to sleep at night or unable to focus . Irritability or moody . Being tearful (crying) or depressed These are common complaints, possibly related to your anesthesia medications that put you to sleep, stress of surgery, and change in lifestyle.  This usually goes away a few weeks after surgery.  If these feelings continue, call your primary care doctor.   Wound Care:  . Surgical glue:  Looks like a clear film over your incisions and will wear  off a little at a time   . Showering: You may shower two (2) days after your surgery unless your surgeon tells you differently o Wash gently around incisions with warm soapy water, rinse well, and gently pat dry  o No tub baths until staples are removed, steri-strips fall off or glue is gone.    Medications: . Medications should be liquid or crushed if larger than the size of a dime . Extended release pills (medication that release a little bit at a time through the day) should NOT be crushed or cut. (examples include XL, ER, DR, SR) . Depending on the size and number of medications you take, you may need to space (take a few throughout the day)/change the time you take your medications so that you do not over-fill your pouch (smaller stomach) . Make sure you follow-up with your primary care doctor to make medication changes needed during rapid weight loss and life-style changes . If you have diabetes, follow up with the doctor that orders your diabetes medication(s) within one week after surgery and check your blood sugar regularly. . Do not drive while taking prescription pain medication  . It is ok to take Tylenol by the bottle instructions with your pain medicine or instead of your pain medicine as needed.  DO NOT TAKE NSAIDS (EXAMPLES OF NSAIDS:  IBUPROFREN/ NAPROXEN)  Diet:                      First 2 Weeks  You will see the dietician t about two (2) weeks after your surgery. The dietician will increase the types of foods you can eat if you are handling liquids well: . If you have severe vomiting or nausea and cannot keep down clear liquids lasting longer than 1 day, call your surgeon @ ([phone]) Protein Shake . Drink at least 2 ounces of shake 5-6 times per day . Each serving of protein shakes (usually 8 - 12 ounces) should have: o 15 grams of protein  o And no more than 5 grams of carbohydrate  . Goal for protein each day: o Men = 80 grams per day o Women = 60 grams per  day . Protein powder may be added to fluids such as non-fat milk or Lactaid milk or unsweetened Soy/Almond milk (limit to 35 grams added protein powder per serving)  Hydration . Slowly increase the amount of water and other clear liquids as tolerated (See Acceptable Fluids) . Slowly increase the amount of protein shake as tolerated  .  Sip fluids slowly and throughout the day.  Do not use straws. . May use sugar substitutes in small amounts (no more than 6 - 8 packets per day; i.e. Splenda)  Fluid Goal . The first goal is to drink at least 8 ounces of protein shake/drink per day (or as directed by the nutritionist); some examples of protein shakes are Syntrax Nectar, Adkins Advantage, EAS Edge HP, and Unjury. See handout from pre-op Bariatric Education Class: o Slowly increase the amount of protein shake you drink as tolerated o You may find it easier to slowly sip shakes throughout the day o It is important to get your proteins in first . Your fluid goal is to drink 64 - 100 ounces of fluid daily o It may take a few weeks to build up to this . 32 oz (or more) should be clear liquids  And  . 32 oz (or more) should be full liquids (see below for examples) . Liquids should not contain sugar, caffeine, or carbonation  Clear Liquids: . Water or Sugar-free flavored water (i.e. Fruit H2O, Propel) . Decaffeinated coffee or tea (sugar-free) . Crystal Lite, Wyler's Lite, Minute Maid Lite . Sugar-free Jell-O . Bouillon or broth . Sugar-free Popsicle:   *Less than 20 calories each; Limit 1 per day  Full Liquids: Protein Shakes/Drinks + 2 choices per day of other full liquids . Full liquids must be: o No More Than 15 grams of Carbs per serving  o No More Than 3 grams of Fat per serving . Strained low-fat cream soup (except Cream of Potato or Tomato) . Non-Fat milk . Fat-free Lactaid Milk . Unsweetened Soy Or Unsweetened Almond Milk . Low Sugar yogurt (Dannon Lite & Fit, Greek yogurt; Oikos  Triple Zero; Chobani Simply 100; Yoplait 100 calorie Greek - No Fruit on the Bottom)    Vitamins and Minerals . Start 1 day after surgery unless otherwise directed by your surgeon . Chewable Bariatric Specific Multivitamin / Multimineral Supplement with iron (Example: Bariatric Advantage Multi EA) . Chewable Calcium with Vitamin D-3 (Example: 3 Chewable Calcium Plus 600 with Vitamin D-3) o Take 500 mg three (3) times a day for a total of 1500 mg each day o Do not take all 3 doses of calcium at one time as it may cause constipation, and you can only absorb 500 mg  at a time  o Do not mix multivitamins containing iron with calcium supplements; take 2   hours apart . Menstruating women and those with a history of anemia (a blood disease that causes weakness) may need extra iron o Talk with your doctor to see if you need more iron . Do not stop taking or change any vitamins or minerals until you talk to your dietitian or surgeon . Your Dietitian and/or surgeon must approve all vitamin and mineral supplements   Activity and Exercise: Limit your physical activity as instructed by your doctor.  It is important to continue walking at home.  During this time, use these guidelines: . Do not lift anything greater than ten (10) pounds for at least two (2) weeks . Do not go back to work or drive until your surgeon says you can . You may have sex when you feel comfortable  o It is VERY important for female patients to use a reliable birth control method; fertility often increases after surgery  o All hormonal birth control will be ineffective for 30 days after surgery due to medications given during surgery a barrier method must be used. o Do not get pregnant for at least 18 months . Start exercising as soon as your doctor tells you that you can o Make sure your doctor approves any physical activity . Start with a simple walking program . Walk 5-15 minutes each day, 7 days per week.  . Slowly increase  until you are walking 30-45 minutes per day Consider joining our BELT program. [phone] or email [email or web address]   Special Instructions Things to remember: . Use your CPAP when sleeping if this applies to you  . [facility] has two free Bariatric Surgery Support Groups that meet monthly o The 3rd Thursday of each month, 6 pm o The 2nd Friday of each month, 11:30 . It is very important to keep all follow up appointments with your surgeon, dietitian, primary care physician, and behavioral health practitioner . Routine follow up schedule with your surgeon include appointments at 2-3 weeks, 6-8 weeks, 6 months, and 1 year at a minimum.  Your surgeon may request to see you more often.   . After the first year, please follow up with your bariatric surgeon and dietitian at least once a year in order to maintain best weight loss results   [facility]: [phone] [facility]: [phone] Bariatric Nurse Coordinator: [phone]      Reviewed and Endorsed  by [place] Patient Education Committee, June, 2016 Edits Approved: Aug, 2018    

## 2020-04-29 NOTE — Progress Notes (Signed)
Patient alert and oriented, pain is controlled. Patient is tolerating fluids, advanced to protein shake today, patient is tolerating well. Reviewed Gastric Bypass discharge instructions with patient and patient is able to articulate understanding. Provided information on BELT program, Support Group and [facility] outpatient pharmacy. All questions answered, will continue to monitor.  Total fluid intake 705 Per dehydration protocol call back one week post op

## 2020-05-04 ENCOUNTER — Telehealth (HOSPITAL_COMMUNITY): Payer: Self-pay

## 2020-05-04 NOTE — Telephone Encounter (Signed)
Patient called to discuss post bariatric surgery follow up questions.  See below:   1.  Tell me about your pain and pain management? Liquid tylenol  2.  Let's talk about fluid intake.  How much total fluid are you taking in? 60 ounces of water  3.  How much protein have you taken in the last 2 days? 63 grams of protein  4.  Have you had nausea?  Tell me about when have experienced nausea and what you did to help?denies  5.  Has the frequency or color changed with your urine? Urine color light   6.  Tell me what your incisions look like?no problems  7.  Have you been passing gas? BM? Had  BM  8.  If a problem or question were to arise who would you call?  Do you know contact numbers for [facility], [facility], and [facility]?  9.  How has the walking going?walking regularly  10.  How are your vitamins and calcium going?  How are you taking them?taking vitamins and calcium without difficulty

## 2020-05-11 ENCOUNTER — Encounter: Payer: Federal, State, Local not specified - PPO | Attending: Surgery | Admitting: Skilled Nursing Facility1

## 2020-05-11 ENCOUNTER — Other Ambulatory Visit: Payer: Self-pay

## 2020-05-13 NOTE — Progress Notes (Signed)
2 Week Post-Operative Nutrition Class   Patient was seen on 09/24/18 for Post-Operative Nutrition education at the [facility].    Surgery date: 04/27/2020 Surgery type: RYGB Start weight at [facility]: 213.8 Weight today: 193.6   Body Composition Scale 05/13/2020  Total Body Fat % 44.3  Visceral Fat 16  Fat-Free Mass % 55.6   Total Body Water % 42.3   Muscle-Mass lbs 25.5  Body Fat Displacement          Torso  lbs 53.1         Left Leg  lbs 10.6         Right Leg  lbs 10.6         Left Arm  lbs 5.3         Right Arm   lbs 5.3     The following the learning objectives were met by the patient during this course:  Identifies Phase 3 (Soft, High Proteins) Dietary Goals and will begin from 2 weeks post-operatively to 2 months post-operatively  Identifies appropriate sources of fluids and proteins   Identifies appropriate fat sources and healthy verses unhealthy fat types    States protein recommendations and appropriate sources post-operatively  Identifies the need for appropriate texture modifications, mastication, and bite sizes when consuming solids  Identifies appropriate multivitamin and calcium sources post-operatively  Describes the need for physical activity post-operatively and will follow MD recommendations  States when to call healthcare provider regarding medication questions or post-operative complications   Handouts given during class include:  Phase 3A: Soft, High Protein Diet Handout  Healthy Fats   Follow-Up Plan: Patient will follow-up at [facility] in 6 weeks for 2 month post-op nutrition visit for diet advancement per MD.

## 2020-05-17 ENCOUNTER — Telehealth: Payer: Self-pay | Admitting: Skilled Nursing Facility1

## 2020-05-17 NOTE — Telephone Encounter (Signed)
RD called pt to verify fluid intake once starting soft, solid proteins 2 week post-bariatric surgery.  ° °Daily Fluid intake: 64+ °Daily Protein intake: 60+ ° °Concerns/issues:  ° °None stated °

## 2020-06-22 ENCOUNTER — Ambulatory Visit: Payer: Federal, State, Local not specified - PPO | Admitting: Dietician

## 2020-07-05 ENCOUNTER — Encounter: Payer: Federal, State, Local not specified - PPO | Attending: Surgery | Admitting: Dietician

## 2020-07-05 ENCOUNTER — Encounter: Payer: Self-pay | Admitting: Dietician

## 2020-07-05 DIAGNOSIS — E669 Obesity, unspecified: Secondary | ICD-10-CM | POA: Diagnosis not present

## 2020-07-05 NOTE — Progress Notes (Signed)
Bariatric Nutrition Follow-Up Visit Medical Nutrition Therapy   MyChart Visit  2 Months Post-Operative RYGB Surgery Surgery Date: 04/27/2020  Pt's Expectations of Surgery/ Goals: to lose weight  Pt Reported Successes: being aware of which foods do/don't work for her    NUTRITION ASSESSMENT  Anthropometrics  Start weight at [facility]: 213.8 lbs (date: 12/11/19) Today's weight: 172 lbs (pt reported)   Body Composition Scale 05/13/20  Weight  lbs 193.6  BMI 39.1  Total Body Fat  % 44.3     Visceral Fat 16  Fat-Free Mass  % 55.6     Total Body Water  % 42.3     Muscle-Mass  lbs 25.5  Body Fat Displacement ---         Torso  lbs 53.1         Left Leg  lbs 10.6         Right Leg  lbs 10.6         Left Arm  lbs 5.3         Right Arm  lbs 5.3   Lifestyle & Dietary Hx States that some foods trigger nausea but very mild. Does well at meeting protein and fluid goals daily. Already incorporating foods such as cereal, crackers, and vegetables but states she tolerates most of her foods fine. Meeting daily fluid goal with drinking primarily water plus a cup of coffee daily.   24-Hr Dietary Recall First Meal: Oikos yogurt + protein Special K cereal  Snack: -  Second Meal: Balanced Break snack  Snack: -  Third Meal: meat + vegetables  Snack: - Beverages: water, protein water, coffee  Estimated daily fluid intake: 64+ oz Estimated daily protein intake: 60 g Supplements: bariatric MVI, calcium  Current average weekly physical activity: taking care of her horses, walking    Post-Op Goals/ Signs/ Symptoms Using straws: yes  Drinking while eating: no Chewing/swallowing difficulties: no Changes in vision: no Changes to mood/headaches: no Hair loss/changes to skin/nails: no Difficulty focusing/concentrating: no Sweating: no Dizziness/lightheadedness: no Palpitations: no  Carbonated/caffeinated beverages: coffee  N/V/D/C/Gas: constipation  Abdominal pain: no Dumping syndrome: no    NUTRITION DIAGNOSIS  Overweight/obesity ([place]-3.3) related to past poor dietary habits and physical inactivity as evidenced by completed bariatric surgery and following dietary guidelines for continued weight loss and healthy nutrition status.   NUTRITION INTERVENTION Nutrition counseling (C-1) and education (E-2) to facilitate bariatric surgery goals, including: . Diet advancement to the next phase (phase 4) now including non-starchy vegetables . The importance of consuming adequate calories as well as certain nutrients daily due to the body's need for essential vitamins, minerals, and fats . The importance of daily physical activity and to reach a goal of at least 150 minutes of moderate to vigorous physical activity weekly (or as directed by their physician) due to benefits such as increased musculature and improved lab values  Handouts Provided Include   Bariatric Snack Ideas  (via email at [email or web address])  Learning Style & Readiness for Change Teaching method utilized: Visual & Auditory  Demonstrated degree of understanding via: Teach Back  Barriers to learning/adherence to lifestyle change: None Identified    MONITORING & EVALUATION Dietary intake, weekly physical activity, body weight, and goals in 3 months.  Next Steps Patient is to follow-up in 3 months via MyChart for 6 month post-op follow-up.

## 2020-07-05 NOTE — Patient Instructions (Signed)

## 2020-10-04 ENCOUNTER — Telehealth: Payer: Federal, State, Local not specified - PPO | Admitting: Skilled Nursing Facility1

## 2021-08-09 IMAGING — DX DG CHEST 2V
2 series · 2 of 2 positions shown · non-contrast
Comparison: Upper GI series from the same day.

CLINICAL DATA: 54-year-old female preoperative study for bariatric
surgery.

EXAM:
CHEST - 2 VIEW

[chest pa]
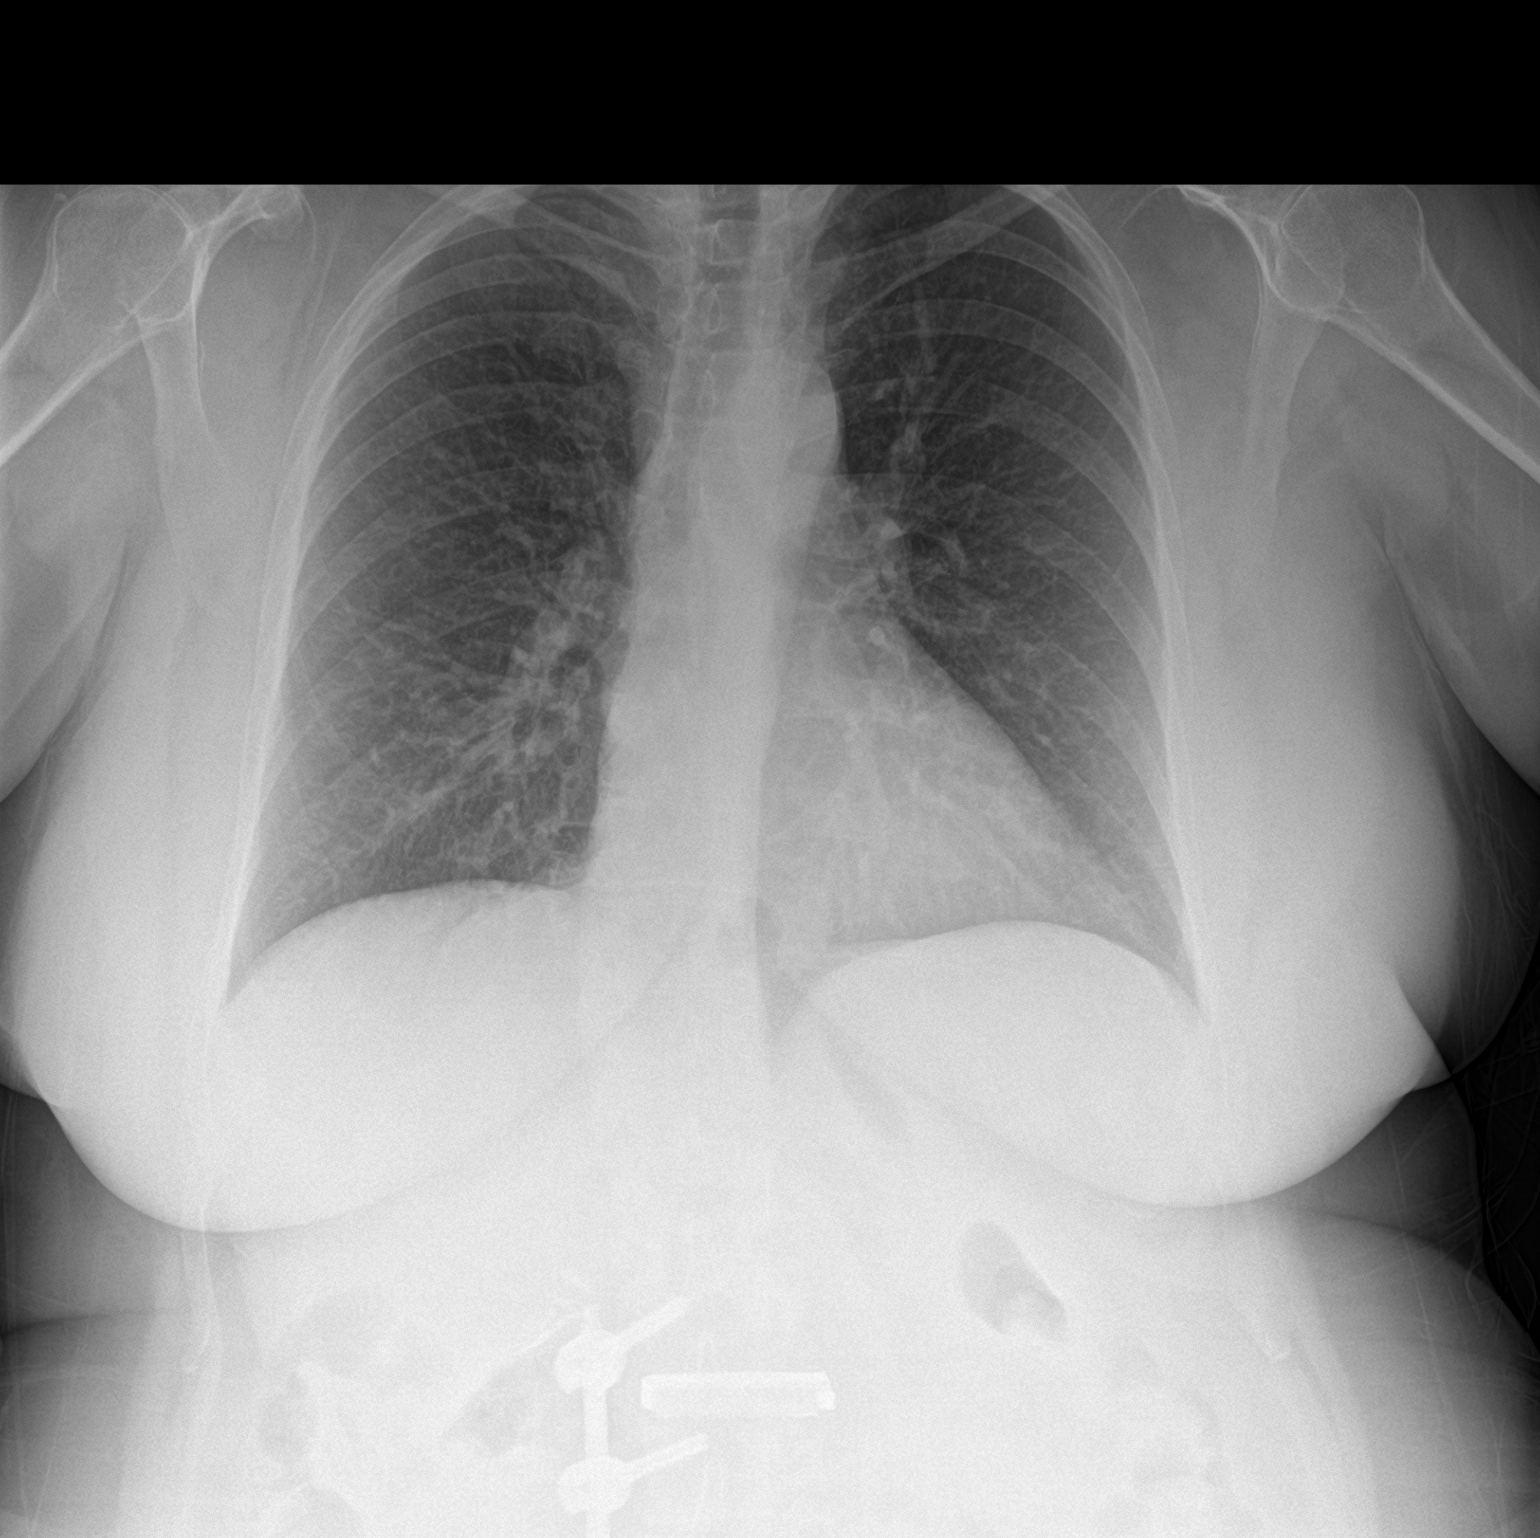

[chest lat]
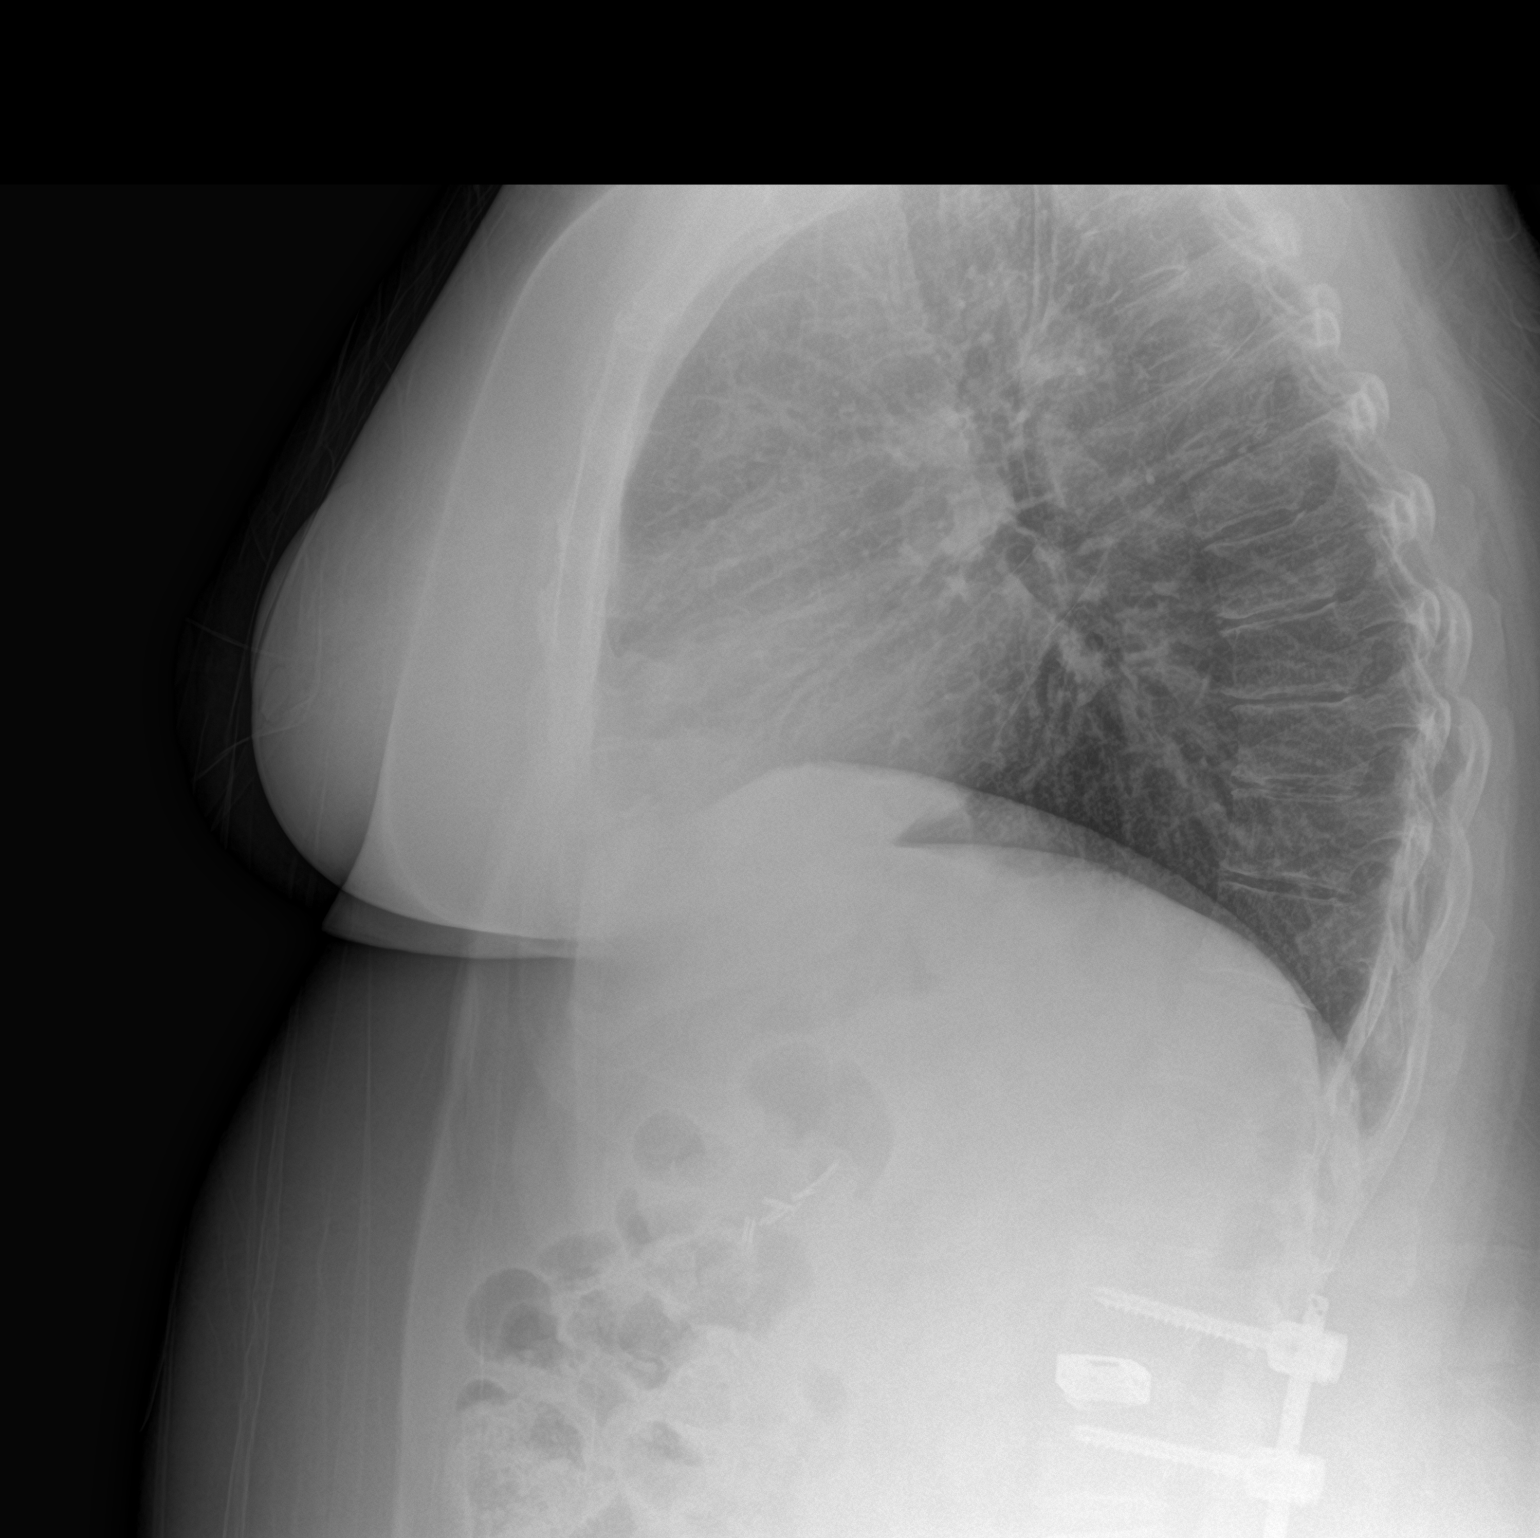

[2 of 2 positions shown; findings below may reference images not displayed]

FINDINGS: Normal lung volumes and mediastinal contours. Visualized tracheal
air column is within normal limits. Borderline to mild increased
pulmonary interstitial markings, likely chronic, and otherwise both
lungs appear clear. No pneumothorax or pleural effusion.

Cholecystectomy clips. Previous lumbar fusion. Negative visible
bowel gas pattern. No acute osseous abnormality identified.
IMPRESSION: No acute cardiopulmonary abnormality.

## 2021-11-11 ENCOUNTER — Encounter (HOSPITAL_COMMUNITY): Payer: Self-pay | Admitting: *Deleted

## 2022-11-17 ENCOUNTER — Encounter (HOSPITAL_COMMUNITY): Payer: Self-pay | Admitting: *Deleted
